# Patient Record
Sex: Female | Born: 1975 | Race: White | Hispanic: No | Marital: Married | Smoking: Never smoker
Health system: Southern US, Community
[De-identification: ages and names within clinical notes are randomized; demographics above are authoritative.]

## PROBLEM LIST (undated history)

## (undated) DIAGNOSIS — I10 Essential (primary) hypertension: Secondary | ICD-10-CM

## (undated) DIAGNOSIS — K589 Irritable bowel syndrome without diarrhea: Secondary | ICD-10-CM

## (undated) DIAGNOSIS — F32A Depression, unspecified: Secondary | ICD-10-CM

## (undated) DIAGNOSIS — R112 Nausea with vomiting, unspecified: Secondary | ICD-10-CM

## (undated) DIAGNOSIS — D497 Neoplasm of unspecified behavior of endocrine glands and other parts of nervous system: Secondary | ICD-10-CM

## (undated) DIAGNOSIS — I499 Cardiac arrhythmia, unspecified: Secondary | ICD-10-CM

## (undated) DIAGNOSIS — K219 Gastro-esophageal reflux disease without esophagitis: Secondary | ICD-10-CM

## (undated) DIAGNOSIS — I4711 Inappropriate sinus tachycardia, so stated: Secondary | ICD-10-CM

## (undated) DIAGNOSIS — F329 Major depressive disorder, single episode, unspecified: Secondary | ICD-10-CM

## (undated) DIAGNOSIS — F419 Anxiety disorder, unspecified: Secondary | ICD-10-CM

## (undated) DIAGNOSIS — G4730 Sleep apnea, unspecified: Secondary | ICD-10-CM

## (undated) DIAGNOSIS — Z9889 Other specified postprocedural states: Secondary | ICD-10-CM

## (undated) HISTORY — PX: TUBAL LIGATION: SHX77

## (undated) HISTORY — DX: Sleep apnea, unspecified: G47.30

## (undated) HISTORY — PX: APPENDECTOMY: SHX54

## (undated) HISTORY — DX: Gastro-esophageal reflux disease without esophagitis: K21.9

## (undated) HISTORY — DX: Irritable bowel syndrome, unspecified: K58.9

## (undated) HISTORY — DX: Neoplasm of unspecified behavior of endocrine glands and other parts of nervous system: D49.7

---

## 2000-11-09 HISTORY — PX: NASAL SEPTUM SURGERY: SHX37

## 2001-10-02 ENCOUNTER — Ambulatory Visit (HOSPITAL_COMMUNITY): Admission: RE | Admit: 2001-10-02 | Discharge: 2001-10-02 | Payer: Self-pay | Admitting: Internal Medicine

## 2001-10-02 ENCOUNTER — Encounter: Payer: Self-pay | Admitting: Internal Medicine

## 2004-11-09 HISTORY — PX: ESOPHAGOGASTRODUODENOSCOPY: SHX1529

## 2004-11-09 HISTORY — PX: COLONOSCOPY: SHX174

## 2008-11-09 HISTORY — PX: ESOPHAGOGASTRODUODENOSCOPY: SHX1529

## 2009-11-09 HISTORY — PX: CHOLECYSTECTOMY: SHX55

## 2009-11-09 HISTORY — PX: ESOPHAGOGASTRODUODENOSCOPY: SHX1529

## 2010-11-09 HISTORY — PX: TONSILLECTOMY: SUR1361

## 2012-06-06 ENCOUNTER — Other Ambulatory Visit: Payer: Self-pay | Admitting: Internal Medicine

## 2012-06-06 DIAGNOSIS — D352 Benign neoplasm of pituitary gland: Secondary | ICD-10-CM

## 2012-06-10 ENCOUNTER — Ambulatory Visit (HOSPITAL_COMMUNITY)
Admission: RE | Admit: 2012-06-10 | Discharge: 2012-06-10 | Disposition: A | Discharge status: Home / Self Care | Payer: Federal, State, Local not specified - PPO | Source: Ambulatory Visit | Attending: Internal Medicine | Admitting: Internal Medicine

## 2012-06-10 DIAGNOSIS — D353 Benign neoplasm of craniopharyngeal duct: Secondary | ICD-10-CM | POA: Insufficient documentation

## 2012-06-10 DIAGNOSIS — D352 Benign neoplasm of pituitary gland: Secondary | ICD-10-CM | POA: Insufficient documentation

## 2012-06-10 MED ORDER — GADOBENATE DIMEGLUMINE 529 MG/ML IV SOLN
15.0000 mL | Freq: Once | INTRAVENOUS | Status: AC
  Administered 2012-06-10: 15 mL via INTRAVENOUS

## 2014-09-13 ENCOUNTER — Ambulatory Visit (INDEPENDENT_AMBULATORY_CARE_PROVIDER_SITE_OTHER): Payer: Federal, State, Local not specified - PPO | Admitting: Gastroenterology

## 2014-09-13 ENCOUNTER — Encounter: Payer: Self-pay | Admitting: Gastroenterology

## 2014-09-13 VITALS — BP 139/94 | HR 89 | Temp 98.1°F | Ht 62.0 in | Wt 243.0 lb

## 2014-09-13 DIAGNOSIS — R1084 Generalized abdominal pain: Secondary | ICD-10-CM

## 2014-09-13 DIAGNOSIS — K589 Irritable bowel syndrome without diarrhea: Secondary | ICD-10-CM

## 2014-09-13 DIAGNOSIS — K219 Gastro-esophageal reflux disease without esophagitis: Secondary | ICD-10-CM | POA: Insufficient documentation

## 2014-09-13 DIAGNOSIS — R109 Unspecified abdominal pain: Secondary | ICD-10-CM | POA: Insufficient documentation

## 2014-09-13 DIAGNOSIS — K2100 Gastro-esophageal reflux disease with esophagitis, without bleeding: Secondary | ICD-10-CM

## 2014-09-13 DIAGNOSIS — E559 Vitamin D deficiency, unspecified: Secondary | ICD-10-CM | POA: Insufficient documentation

## 2014-09-13 LAB — CBC WITH DIFFERENTIAL/PLATELET
Basophils Absolute: 0 10*3/uL (ref 0.0–0.1)
Basophils Relative: 0 % (ref 0–1)
Eosinophils Absolute: 0.1 10*3/uL (ref 0.0–0.7)
Eosinophils Relative: 1 % (ref 0–5)
HCT: 38.4 % (ref 36.0–46.0)
Hemoglobin: 13 g/dL (ref 12.0–15.0)
Lymphocytes Relative: 30 % (ref 12–46)
Lymphs Abs: 2.1 10*3/uL (ref 0.7–4.0)
MCH: 28.3 pg (ref 26.0–34.0)
MCHC: 33.9 g/dL (ref 30.0–36.0)
MCV: 83.5 fL (ref 78.0–100.0)
Monocytes Absolute: 0.5 10*3/uL (ref 0.1–1.0)
Monocytes Relative: 7 % (ref 3–12)
Neutro Abs: 4.4 10*3/uL (ref 1.7–7.7)
Neutrophils Relative %: 62 % (ref 43–77)
Platelets: 250 10*3/uL (ref 150–400)
RBC: 4.6 MIL/uL (ref 3.87–5.11)
RDW: 13.7 % (ref 11.5–15.5)
WBC: 7.1 10*3/uL (ref 4.0–10.5)

## 2014-09-13 MED ORDER — ESOMEPRAZOLE MAGNESIUM 40 MG PO CPDR: 40.0000 mg | DELAYED_RELEASE_CAPSULE | Freq: Every day | ORAL | Status: DC

## 2014-09-13 MED ORDER — VITAMIN D (ERGOCALCIFEROL) 1.25 MG (50000 UNIT) PO CAPS: 50000.0000 [IU] | ORAL_CAPSULE | ORAL | Status: DC

## 2014-09-13 MED ORDER — PAROXETINE HCL 20 MG PO TABS: 20.0000 mg | ORAL_TABLET | Freq: Every day | ORAL | Status: DC

## 2014-09-13 MED ORDER — DICYCLOMINE HCL 10 MG PO CAPS: ORAL_CAPSULE | ORAL | Status: DC

## 2014-09-13 NOTE — Progress Notes (Signed)
Primary Care Physician:  Foye Spurling, MD  Primary Gastroenterologist:  Barney Drain, MD   Chief Complaint  Patient presents with  . Referral    GERD-Hiatial Hernia- IBS    HPI:  Tamara Lynch is a 38 y.o. female here as a self-referral for history of GERD, IBS. Previously gastroenterologist was Dr. Kenna Gilbert however he has subsequently retired. Last seen by him back in 2013. History of documented erosive reflux esophagitis but had evidence of good healing on Nexium in the past. Previously failed Prilosec, Protonix, AcipHex. Never tried Dexilant.   Ran out of Nexium. Tried over-the-counter Nexium but not working. On Paxil for management of IBS per patient but getting ready to run out. Her PCP left town and she's trying to get a new one. Only active MD right now is her endocrinologist, Dr. 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 She gives h/o vit D deficiency and ran out of her RX too.    Recently has had flareup of her IBS. No longer having any solid stools. Up to 6 BMs daily. No nocturnal diarrhea. Previously would have multiple stools daily but more solid. No melena, brbr. Worse for 4-6 months. Denies melena or rectal bleeding. Complains of postprandial abdominal pain especially with certain foods like roughage. Gives a history of bowel salt diarrhea as well. Ever tried on Questran or antispasmodics. Denies recent ill contacts, travel abroad, camping, antibiotic use.  Current Outpatient Prescriptions  Medication Sig Dispense Refill  . cabergoline (DOSTINEX) 0.5 MG tablet Take 0.25 mg by mouth 2 (two) times a week.    . esomeprazole (NEXIUM) 40 MG capsule Take 40 mg by mouth daily at 12 noon.    Marland Kitchen PARoxetine (PAXIL) 20 MG tablet Take 20 mg by mouth daily.    . Vitamin D, Ergocalciferol, (DRISDOL) 50000 UNITS CAPS capsule Take 50,000 Units by mouth every 7 (seven) days.     No current facility-administered medications for this visit.    Allergies as of 09/13/2014  . (Not on  File)    Past Medical History  Diagnosis Date  . IBS (irritable bowel syndrome)   . GERD (gastroesophageal reflux disease)   . 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 Cholecystectomy  2011  . Esophagogastroduodenoscopy    . Esophagogastroduodenoscopy    . Colonoscopy    . Tonsillectomy  2012  . Appendectomy      Family History  Problem Relation Age of Onset  . Barrett's esophagus Sister   . Colon cancer Neg Hx   . Colon polyps Neg Hx   . Cervical cancer Mother   . Breast cancer Mother   . Heart attack Father     age 3, deceased    History   Social History  . Marital Status: Married    Spouse Name: N/A    Number of Children: 2  . Years of Education: N/A   Occupational History  . special education teacher aide    Social History Main Topics  . Smoking status: Never Smoker   . Smokeless tobacco: Not on file  . Alcohol Use: No  . Drug Use: No  . Sexual Activity: Not on file   Other Topics Concern  . Not on file   Social History Narrative  . No narrative on file      ROS:  General: Negative for anorexia, weight loss, fever, chills, fatigue, weakness. Eyes: Negative for vision  changes.  ENT: Negative for hoarseness, difficulty swallowing , nasal congestion. CV: Negative for chest pain, angina, palpitations, dyspnea on exertion, peripheral edema.  Respiratory: Negative for dyspnea at rest, dyspnea on exertion, cough, sputum, wheezing.  GI: See history of present illness. GU:  Negative for dysuria, hematuria, urinary incontinence, urinary frequency, nocturnal urination.  MS: Negative for joint pain, low back pain.  Derm: Negative for rash or itching.  Neuro: Negative for weakness, abnormal sensation, seizure, frequent headaches, memory loss, confusion.  Psych: Negative for anxiety, depression, suicidal ideation, hallucinations.  Endo: Negative for unusual weight  change.  Heme: Negative for bruising or bleeding. Allergy: Negative for rash or hives.    Physical Examination:  BP 139/94 mmHg  Pulse 89  Temp(Src) 98.1 F (36.7 C) (Oral)  Ht 5\' 2"  (1.575 m)  Wt 243 lb (110.224 kg)  BMI 44.43 kg/m2   General: Well-nourished, well-developed in no acute distress.  Head: Normocephalic, atraumatic.   Eyes: Conjunctiva pink, no icterus. Mouth: Oropharyngeal mucosa moist and pink , no lesions erythema or exudate. Neck: Supple without thyromegaly, masses, or lymphadenopathy.  Lungs: Clear to auscultation bilaterally.  Heart: Regular rate and rhythm, no murmurs rubs or gallops.  Abdomen: Bowel sounds are normal, nontender, nondistended, no hepatosplenomegaly or masses, no abdominal bruits or    hernia , no rebound or guarding.   Rectal: not performed Extremities: No lower extremity edema. No clubbing or deformities.  Neuro: Alert and oriented x 4 , grossly normal neurologically.  Skin: Warm and dry, no rash or jaundice.   Psych: Alert and cooperative, normal mood and affect.   Imaging Studies: No results found.

## 2014-09-13 NOTE — Patient Instructions (Addendum)
1. RX for Nexium, Bentyl, Paxil sent to your mail-order. Future RX for Paxil needs to come from PCP. 2. RX for Vit D sent to [facility]. Future RX for Vit D needs to come from PCP. 3. Please have your labs done. 4. Return for follow up in 8 weeks.  5. Call in interim if you have any problems.

## 2014-09-14 ENCOUNTER — Encounter: Payer: Self-pay | Admitting: Gastroenterology

## 2014-09-14 LAB — COMPREHENSIVE METABOLIC PANEL
ALT: 28 U/L (ref 0–35)
AST: 16 U/L (ref 0–37)
Albumin: 4.7 g/dL (ref 3.5–5.2)
Alkaline Phosphatase: 58 U/L (ref 39–117)
BUN: 11 mg/dL (ref 6–23)
CO2: 22 mEq/L (ref 19–32)
Calcium: 9.3 mg/dL (ref 8.4–10.5)
Chloride: 103 mEq/L (ref 96–112)
Creat: 0.57 mg/dL (ref 0.50–1.10)
Glucose, Bld: 69 mg/dL — ABNORMAL LOW (ref 70–99)
Potassium: 3.7 mEq/L (ref 3.5–5.3)
Sodium: 138 mEq/L (ref 135–145)
Total Bilirubin: 1 mg/dL (ref 0.2–1.2)
Total Protein: 7 g/dL (ref 6.0–8.3)

## 2014-09-14 LAB — VITAMIN D 25 HYDROXY (VIT D DEFICIENCY, FRACTURES): Vit D, 25-Hydroxy: 25 ng/mL — ABNORMAL LOW (ref 30–89)

## 2014-09-14 NOTE — Assessment & Plan Note (Signed)
38 year old lady gives a 10 year history of IBS with frequent loose stools, previously managed with Paxil as symptoms typically were aggravated by anxiety. She denies ever trying antispasmodic therapy. Previous colonoscopy unremarkable as outlined. Celiac screen negative along with negative small bowel biopsies as well. She gives a history of bowel salt diarrhea since her cholecystectomy as well.  Obtain baseline labs. Trial of Bentyl. Prescription provided. Renewed her Paxil for 90 day supply, future refills to be obtained from future PCP.  Return to the office for follow-up in 8 weeks.

## 2014-09-14 NOTE — Assessment & Plan Note (Signed)
Recent flare off of medication. Resume Nexium 40 mg daily. Prescription provided. Discussed antireflux measures. Return to the office in 8 weeks for follow-up.

## 2014-09-14 NOTE — Assessment & Plan Note (Signed)
Check labs. Limited refills provided. I have advised her to get future refills from PCP (she is trying to find one) or endocrinologist.

## 2014-09-17 ENCOUNTER — Encounter: Payer: Self-pay | Admitting: Gastroenterology

## 2014-09-17 DIAGNOSIS — R197 Diarrhea, unspecified: Secondary | ICD-10-CM

## 2014-09-18 NOTE — Progress Notes (Signed)
Quick Note:  Pt is aware. ______ 

## 2014-09-18 NOTE — Progress Notes (Signed)
Quick Note:  Please let patient know her labs look good except her Vitamin D is slightly low. She is on supplement.  I recommend she discuss her ongoing vit D deficiency with Dr. Carlis Abbott her endocrinologist. ______

## 2014-09-19 NOTE — Telephone Encounter (Signed)
Please have patient collect stool studies (C.diff, culture, Giardia). Orders entered.   Increase bentyl to max of 20mg  QID when diarrhea is more severe.  Does she have any blood in stool, fever? Get more details about her pain?

## 2014-09-19 NOTE — Progress Notes (Signed)
cc'ed to pcp °

## 2014-09-21 NOTE — Telephone Encounter (Signed)
Has this been addressed?

## 2014-09-21 NOTE — Telephone Encounter (Signed)
I spoke with the pt. She finally got the Bentyl from the mail order and she has been taking it and feeling much better. She is not having diarrhea at this time. She did say that she did not get her nexium from them and asked if I could check on it. I called [facility] and it needs a PA . I will get PA done today.

## 2014-09-21 NOTE — Telephone Encounter (Signed)
PA has been done and faxed to the insurance company. Sent message to the pt via mychart.

## 2014-09-21 NOTE — Telephone Encounter (Signed)
Thank you :)

## 2014-10-03 ENCOUNTER — Telehealth: Payer: Self-pay | Admitting: *Deleted

## 2014-10-03 NOTE — Telephone Encounter (Signed)
  [place] APPROVED NEXIUM. VALID 07/24/2014 - 09/22/15

## 2014-10-29 ENCOUNTER — Encounter: Payer: Self-pay | Admitting: Gastroenterology

## 2014-11-13 ENCOUNTER — Telehealth: Payer: Self-pay | Admitting: Nurse Practitioner

## 2014-11-13 DIAGNOSIS — R197 Diarrhea, unspecified: Secondary | ICD-10-CM

## 2014-11-13 NOTE — Progress Notes (Signed)
We are sorry that you are not feeling well.  Here is how we plan to help!  Based on what you have shared with me it looks like you have Acute Infectious Diarrhea.  Most cases of acute diarrhea are due to infections with virus and bacteria and are self-limited conditions lasting less than 14 days.  For your symptoms you may take Imodium 2 mg tablets that are over the counter at your local pharmacy. Take two tablet now and then one after each loose stool up to 6 a day.  Antibiotics are not needed for most people with diarrhea.  Optional: I have sent in Cipro 500 mg two tablets twice a day for five days or azithromycin 500 mg daily for 3 days.  HOME CARE  We recommend changing your diet to help with your symptoms for the next few days.  Drink plenty of fluids that contain water salt and sugar. Sports drinks such as Gatorade may help.   You may try broths, soups, bananas, applesauce, soft breads, mashed potatoes or crackers.   You are considered infectious for as long as the diarrhea continues. Hand washing or use of alcohol based hand sanitizers is recommend.  It is best to stay out of work or school until your symptoms stop.   GET HELP RIGHT AWAY  If you have dark yellow colored urine or do not pass urine frequently you should drink more fluids.    If your symptoms worsen   If you feel like you are going to pass out (faint)  You have a new problem  MAKE SURE YOU   Understand these instructions.  Will watch your condition.  Will get help right away if you are not doing well or get worse.  Your e-visit answers were reviewed by a board certified advanced clinical practitioner to complete your personal care plan.  Depending on the condition, your plan could have included both over the counter or prescription medications.  If there is a problem please reply  once you have received a response from your provider.  Your safety is important to [place].  If you have drug allergies check  your prescription carefully.    You can use MyChart to ask questions about today's visit, request a non-urgent call back, or ask for a work or school excuse.  You will get an e-mail in the next two days asking about your experience.  I hope that your e-visit has been valuable and will speed your recovery. Thank you for using e-visits.

## 2014-12-18 ENCOUNTER — Other Ambulatory Visit: Payer: Self-pay

## 2015-01-28 ENCOUNTER — Other Ambulatory Visit: Payer: Self-pay | Admitting: Gastroenterology

## 2015-01-29 NOTE — Telephone Encounter (Signed)
Needs further refills for Vit D through PCP.

## 2015-06-05 ENCOUNTER — Encounter: Payer: Self-pay | Admitting: Gastroenterology

## 2015-06-05 ENCOUNTER — Ambulatory Visit (INDEPENDENT_AMBULATORY_CARE_PROVIDER_SITE_OTHER): Payer: Federal, State, Local not specified - PPO | Admitting: Gastroenterology

## 2015-06-05 VITALS — BP 143/88 | HR 85 | Temp 97.2°F | Ht 65.0 in | Wt 243.2 lb

## 2015-06-05 DIAGNOSIS — K589 Irritable bowel syndrome without diarrhea: Secondary | ICD-10-CM | POA: Diagnosis not present

## 2015-06-05 DIAGNOSIS — K219 Gastro-esophageal reflux disease without esophagitis: Secondary | ICD-10-CM

## 2015-06-05 MED ORDER — ESOMEPRAZOLE MAGNESIUM 40 MG PO CPDR: 40.0000 mg | DELAYED_RELEASE_CAPSULE | Freq: Two times a day (BID) | ORAL | Status: DC

## 2015-06-05 MED ORDER — RIFAXIMIN 550 MG PO TABS: 550.0000 mg | ORAL_TABLET | Freq: Three times a day (TID) | ORAL | Status: DC

## 2015-06-05 NOTE — Assessment & Plan Note (Signed)
Breakthrough symptoms couple of times per week. Significant discomfort when they occur. Trial of taking extra Nexium as needed but limit as much as possible. Antireflux measures discussed. She was advised not to increase Celexa beyond 20mg  as long as she is on Nexium due to potential side effects. Progress report in couple of weeks. It is unclear if abdominal pain related to gastritis, gerd, or IBS. If ongoing symptoms, may need further work up.

## 2015-06-05 NOTE — Patient Instructions (Signed)
1. Take Nexium once daily before breakfast but okay to take additional dose on days you have breakthrough heartburn. Try to limit using second dose of Nexium as much as possible. If you have to increase Celexa at any time, we may need to switch out Nexium because the Nexium and high dose Celexa and interact. RX sent to [facility].  2. Trial of Xifaxan. Take three times daily for 14 days. RX sent to [facility]. 3. Let me know in a couple of weeks how you are doing.  4. Return to the office in six months or sooner if needed.

## 2015-06-05 NOTE — Assessment & Plan Note (Signed)
Bentyl helps control diarrhea but still with constant crampy pain. Trial of Xifaxan for two weeks. If ongoing pain, she will let me know. Further work up may be needed.

## 2015-06-05 NOTE — Progress Notes (Signed)
Please find out what patient's allergies are. None have ever been recorded in EPIC.

## 2015-06-05 NOTE — Progress Notes (Signed)
Primary Care Physician: Yvone Neu, MD  Primary Gastroenterologist:  Barney Drain, MD   Chief Complaint  Patient presents with  . Follow-up    HPI: Tamara Lynch is a 39 y.o. female here for follow up GERD, IBS. Last seen in 09/2015.   H/O documented erosive reflux esophagitis but had evidence of good healing on Nexium in past. Previously failed Aciphex, Prilosec, Protonix, Aciphex. Never tried Dexilant.   Complained of intermittent heartburn on Nexium. Doesn't matter what she eats but worse with meals. Burns so bad, cannot eat rest of day. Always postprandial. TUMS helps very little. No dysphagia. No NSAIDS/ASA. No melena.   Complains of constant abdominal pain, crampy in nature. Not worse with meals. Sometimes wakes up in middle of night due to pain and has to have BM, rarely happens. Not always associated with BM. No brbpr. Takes bentyl every Monday morning and this helps eliminate diarrhea. If takes more frequently then she has constipation. Switched from Paxil to Celexa which has also helped with diarrhea related to anxiety.     Current Outpatient Prescriptions  Medication Sig Dispense Refill  . cabergoline (DOSTINEX) 0.5 MG tablet Take 0.25 mg by mouth 2 (two) times a week.    . citalopram (CELEXA) 20 MG tablet Take 20 mg by mouth daily.     Marland Kitchen dicyclomine (BENTYL) 10 MG capsule Take 1-2 orally before meals and at bedtime as needed for diarrhea and abdominal pain. 540 capsule 3  . esomeprazole (NEXIUM) 40 MG capsule Take 1 capsule (40 mg total) by mouth daily before breakfast. 90 capsule 3  . Vitamin D, Ergocalciferol, (DRISDOL) 50000 UNITS CAPS capsule Take 1 capsule (50,000 Units total) by mouth every 7 (seven) days. 4 capsule 2   No current facility-administered medications for this visit.    Allergies as of 06/05/2015  . (Not on File)   Past Surgical History  Procedure Laterality Date  . Cholecystectomy  2011  . Esophagogastroduodenoscopy  2011   Dr. Algis Greenhouse. Propofol. hiatal hernia. No Barrett's.  . Esophagogastroduodenoscopy  2010    Dr. Algis Greenhouse: propofol.hiatal hernia and erosions. YQ:IHKV chronic gastritis/esophagitis  . Colonoscopy  2006    Dr. Algis Greenhouse: normal colonoscopy, Biopsy showed mild chronic inflammation. No evidence of microscopic colitis.  . Tonsillectomy  2012  . Appendectomy    . Esophagogastroduodenoscopy  2006    Dr. Docia Furl: normal upper endoscopy. Biopsies of the duodenum negative for celiac disease.TTG also normal    ROS:  General: Negative for anorexia, weight loss, fever, chills, fatigue, weakness. ENT: Negative for hoarseness, difficulty swallowing , nasal congestion. CV: Negative for chest pain, angina, palpitations, dyspnea on exertion, peripheral edema.  Respiratory: Negative for dyspnea at rest, dyspnea on exertion, cough, sputum, wheezing.  GI: See history of present illness. GU:  Negative for dysuria, hematuria, urinary incontinence, urinary frequency, nocturnal urination.  Endo: Negative for unusual weight change.    Physical Examination:   BP 143/88 mmHg  Pulse 85  Temp(Src) 97.2 F (36.2 C) (Oral)  Ht 5\' 5"  (1.651 m)  Wt 243 lb 3.2 oz (110.315 kg)  BMI 40.47 kg/m2  General: Well-nourished, well-developed in no acute distress.  Eyes: No icterus. Mouth: Oropharyngeal mucosa moist and pink , no lesions erythema or exudate. Lungs: Clear to auscultation bilaterally.  Heart: Regular rate and rhythm, no murmurs rubs or gallops.  Abdomen: Bowel sounds are normal, moderate epigastric tenderness, mild mid to left sided tenderness, nondistended, no hepatosplenomegaly or masses, no abdominal bruits or hernia ,  no rebound or guarding.   Extremities: No lower extremity edema. No clubbing or deformities. Neuro: Alert and oriented x 4   Skin: Warm and dry, no jaundice.   Psych: Alert and cooperative, normal mood and affect.    Imaging Studies: No results found.

## 2015-06-10 NOTE — Progress Notes (Signed)
CC'ED TO PCP 

## 2015-06-17 ENCOUNTER — Telehealth: Payer: Federal, State, Local not specified - PPO | Admitting: Family

## 2015-06-17 DIAGNOSIS — R197 Diarrhea, unspecified: Secondary | ICD-10-CM

## 2015-06-17 NOTE — Progress Notes (Signed)
We are sorry that you are not feeling well.  Here is how we plan to help!  Based on what you have shared with me it looks like you have Acute Infectious Diarrhea.  Most cases of acute diarrhea are due to infections with virus and bacteria and are self-limited conditions lasting less than 14 days.  For your symptoms you may take Imodium 2 mg tablets that are over the counter at your local pharmacy. Take two tablet now and then one after each loose stool up to 6 a day.  Antibiotics are not needed for most people with diarrhea.   HOME CARE  We recommend changing your diet to help with your symptoms for the next few days.  Drink plenty of fluids that contain water salt and sugar. Sports drinks such as Gatorade may help.   You may try broths, soups, bananas, applesauce, soft breads, mashed potatoes or crackers.   You are considered infectious for as long as the diarrhea continues. Hand washing or use of alcohol based hand sanitizers is recommend.  It is best to stay out of work or school until your symptoms stop.   GET HELP RIGHT AWAY  If you have dark yellow colored urine or do not pass urine frequently you should drink more fluids.    If your symptoms worsen   If you feel like you are going to pass out (faint)  You have a new problem  MAKE SURE YOU   Understand these instructions.  Will watch your condition.  Will get help right away if you are not doing well or get worse.  Your e-visit answers were reviewed by a board certified advanced clinical practitioner to complete your personal care plan.  Depending on the condition, your plan could have included both over the counter or prescription medications.  If there is a problem please reply  once you have received a response from your provider.  Your safety is important to us.  If you have drug allergies check your prescription carefully.    You can use MyChart to ask questions about today's visit, request a non-urgent call  back, or ask for a work or school excuse.  You will get an e-mail in the next two days asking about your experience.  I hope that your e-visit has been valuable and will speed your recovery. Thank you for using e-visits.   

## 2015-06-18 ENCOUNTER — Telehealth: Payer: Self-pay | Admitting: Gastroenterology

## 2015-06-18 NOTE — Telephone Encounter (Signed)
Pt said she is having a flare. A lot of abdominal cramping. She takes the max amount of Bentyl and Nexium bid. She could not afford the Xifaxin. Please advise!

## 2015-06-18 NOTE — Telephone Encounter (Signed)
PATIENT CALLED STATING SHE WAS HAVING A BAD IBS FLARE UP AND THE ZIFAXIN IS TOO HIGH   PLEASE ADVISE IF ANYTHING ELSE CAN BE CALLED IN FOR HER [phone]

## 2015-06-19 MED ORDER — DEXLANSOPRAZOLE 60 MG PO CPDR: 60.0000 mg | DELAYED_RELEASE_CAPSULE | Freq: Every day | ORAL | Status: DC

## 2015-06-19 MED ORDER — ELUXADOLINE 75 MG PO TABS: 75.0000 mg | ORAL_TABLET | Freq: Two times a day (BID) | ORAL | Status: DC

## 2015-06-19 NOTE — Telephone Encounter (Addendum)
1. Please let patient know that I am going to send in RX for Dexilant 60mg  daily before breakfast (stop Nexium) and Viberzi 75mg  BID with food (stop Bentyl). ACTUALLY I PRINTED SINCE I DIDN''T KNOW WHICH LOCAL PHARMACY AND VIBERZI WILL NOT E-SCRIBE.Viberzi is for IBS-diarrhea. Hold if gets constipated. Do not consume etoh with it. 2. Please have her activate the rebate cards for both before trying to get RX filled. 3. Call if not covered and we will try prior authorization.  4. Feel free to sample few days of each to patient. 5. Call if no better after on new meds for 2 weeks OR if things worsen.

## 2015-06-19 NOTE — Telephone Encounter (Signed)
Pt called back to the office and she states that the following.   1. Allergic to Biaxin and Tequin 2. 6-8 stools per day 3. Never has constipation 4. Heart burn isn't well controlled 5. No fever 6.no blood in stool

## 2015-06-19 NOTE — Telephone Encounter (Signed)
Pt is aware and husband will be by to pick up samples

## 2015-06-19 NOTE — Addendum Note (Signed)
Addended by: Mahala Menghini on: 06/19/2015 10:22 AM   Modules accepted: Orders, Medications

## 2015-06-19 NOTE — Telephone Encounter (Signed)
This was sent to me after I left yesterday.  I tried to call patient and had to leave message.   Please find out: What drug allergies she has? How many stools daily? Does she ever have constipation? Is her heartburn well controlled? Does she have fever? Does she have blood in stool or melena?

## 2015-06-24 ENCOUNTER — Telehealth: Payer: Self-pay

## 2015-06-24 ENCOUNTER — Encounter: Payer: Self-pay | Admitting: Gastroenterology

## 2015-06-24 NOTE — Telephone Encounter (Signed)
Pt called and states that she sent an email through mychart this morning. She states that the samples have not helped and she isn't any better. Please advise

## 2015-06-27 NOTE — Telephone Encounter (Signed)
Please let patient know that it may be too soon to tell if Dexilant is going to help her burning since she has been on it only a week. If she would like to give some more time, I would recommend adding Zantac 150mg  BID or Pepcid 20mg  BID, OR we could call in some carafate.   For diarrhea, abdominal cramping, is the Viberzi helping yet?

## 2015-06-28 MED ORDER — SUCRALFATE 1 GM/10ML PO SUSP: 1.0000 g | Freq: Three times a day (TID) | ORAL | Status: DC

## 2015-06-28 NOTE — Telephone Encounter (Signed)
LMOM for a return call and told her we close at 12:00 noon today.

## 2015-06-28 NOTE — Telephone Encounter (Signed)
Pt called and was informed. She said she has had Carafate before and it helped a lot. She would like a prescription for that.  Also, she said the Viberzi is helping diarrhea, but she is still having abdominal cramps after every meal.  Please advise!

## 2015-06-28 NOTE — Telephone Encounter (Signed)
LMOM for pt that if I do not hear from her before we leave at noon, that I will fax the Rx for Carafate to [facility] in [place].

## 2015-06-28 NOTE — Telephone Encounter (Signed)
Please fax Carafate RX. I didn't know which pharmacy to send it too.  Let patient know that I will touch base with Dr. Oneida Alar and see if she has any further recommendations for the cramping. May be first of the week.

## 2015-07-16 ENCOUNTER — Encounter: Payer: Self-pay | Admitting: Gastroenterology

## 2015-07-18 NOTE — Telephone Encounter (Signed)
I will have our staff call to schedule you a follow-up appointment.

## 2015-07-18 NOTE — Telephone Encounter (Signed)
Please call patient to schedule an appointment with someone for worsening GERD (see patient email for further details.)

## 2015-08-02 ENCOUNTER — Other Ambulatory Visit: Payer: Self-pay

## 2015-08-02 ENCOUNTER — Ambulatory Visit (INDEPENDENT_AMBULATORY_CARE_PROVIDER_SITE_OTHER): Payer: Federal, State, Local not specified - PPO | Admitting: Gastroenterology

## 2015-08-02 ENCOUNTER — Encounter: Payer: Self-pay | Admitting: Gastroenterology

## 2015-08-02 VITALS — BP 142/88 | HR 73 | Temp 97.4°F | Ht 62.0 in | Wt 240.8 lb

## 2015-08-02 DIAGNOSIS — R1319 Other dysphagia: Secondary | ICD-10-CM | POA: Insufficient documentation

## 2015-08-02 DIAGNOSIS — K219 Gastro-esophageal reflux disease without esophagitis: Secondary | ICD-10-CM

## 2015-08-02 DIAGNOSIS — K589 Irritable bowel syndrome without diarrhea: Secondary | ICD-10-CM

## 2015-08-02 DIAGNOSIS — R1314 Dysphagia, pharyngoesophageal phase: Secondary | ICD-10-CM

## 2015-08-02 DIAGNOSIS — R1310 Dysphagia, unspecified: Secondary | ICD-10-CM

## 2015-08-02 NOTE — Assessment & Plan Note (Addendum)
39 year old female with history of erosive reflux esophagitis who presents with several month history of refractory symptoms. Failed multiple PPIs as outlined above. Recently showed no signs of improvement on Dexilant. Now with esophageal dysphagia to pills and solid food. Last endoscopy 2011. Recommend EGD at this time for further evaluation. Possible esophageal dilation. Need to exclude complicated GERD, esophagitis related to viral or yeast infection, eosinophilic esophagitis. Previously had EGD with propofol given history of chronic and presence. We'll plan on deep sedation this time as well.  I have discussed the risks, alternatives, benefits with regards to but not limited to the risk of reaction to medication, bleeding, infection, perforation and the patient is agreeable to proceed. Written consent to be obtained.  For now she will increase Nexium to 40 mg twice daily before meals. Continue Carafate 4 times daily. May use Zantac and/or times in addition as well. Discussed antireflux measures.

## 2015-08-02 NOTE — Progress Notes (Signed)
CC'D TO PCP °

## 2015-08-02 NOTE — Progress Notes (Signed)
Primary Care Physician: Yvone Neu, MD  Primary Gastroenterologist:  Barney Drain, MD   Chief Complaint  Patient presents with  . Gastrophageal Reflux    HPI: Tamara Lynch is a 39 y.o. female here for follow-up of GERD and IBS. Last seen in July 2016. At that time she was switched from Nexium to [place] for refractory GERD. She was also given a trial of Viberzi for abdominal cramps and diarrhea.  History of documented erosive reflux esophagitis but had evidence of good healing on Nexium in the past. Previously failed AcipHex, Prilosec, Protonix.  Presents today with refractory GERD. Dexilant did not help. Was on it for six weeks or so. Having to take Carafate four times per day. Throat feels raw. Burning all the time. Severe. Keeps her up at night. One weekend had vomiting with it. Vomiting nonbloody emesis with bile. Hoarseness. Currently back on Nexium for past few days. Dysphagia to pills now over the last few weeks. Having to wash down food.    Viberzi worked for [employer], helped diarrhea. Was on it for few weeks and then started feeling tingling sensation from head to toes and felt like going to pass out. Felt like whole body asleep. Symptoms resolved after stopping the medication. IBS okay right now. Some diarrhea but not bad. No melena, brbpr.   Under a lot of stress. Going to change to Prestiq when get in the mail. Will be weaning of celexa. Valium prn.   Current Outpatient Prescriptions  Medication Sig Dispense Refill  . cabergoline (DOSTINEX) 0.5 MG tablet Take 0.25 mg by mouth 2 (two) times a week.    . citalopram (CELEXA) 20 MG tablet Take 20 mg by mouth daily.     Marland Kitchen dexlansoprazole (DEXILANT) 60 MG capsule Take 1 capsule (60 mg total) by mouth daily. 30 capsule 3  . sucralfate (CARAFATE) 1 GM/10ML suspension Take 10 mLs (1 g total) by mouth 4 (four) times daily -  with meals and at bedtime. 420 mL 1  . Vitamin D, Ergocalciferol, (DRISDOL) 50000 UNITS CAPS  capsule Take 1 capsule (50,000 Units total) by mouth every 7 (seven) days. 4 capsule 2   No current facility-administered medications for this visit.    Allergies as of 08/02/2015 - Review Complete 08/02/2015  Allergen Reaction Noted  . Biaxin [clarithromycin]  06/19/2015  . Tequin [gatifloxacin]  06/19/2015   Past Medical History  Diagnosis Date  . IBS (irritable bowel syndrome)   . GERD (gastroesophageal reflux disease)   . 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 Sleep apnea    Past Surgical History  Procedure Laterality Date  . Cholecystectomy  2011  . Esophagogastroduodenoscopy  2011    Dr. Algis Greenhouse. Propofol. hiatal hernia. No Barrett's.  . Esophagogastroduodenoscopy  2010    Dr. Algis Greenhouse: propofol.hiatal hernia and erosions. KR:CVKF chronic gastritis/esophagitis  . Colonoscopy  2006    Dr. Algis Greenhouse: normal colonoscopy, Biopsy showed mild chronic inflammation. No evidence of microscopic colitis.  . Tonsillectomy  2012  . Appendectomy    . Esophagogastroduodenoscopy  2006    Dr. Docia Furl: normal upper endoscopy. Biopsies of the duodenum negative for celiac disease.TTG also normal   Family History  Problem Relation Age of Onset  . Barrett's esophagus Sister   . Colon cancer Neg Hx   . Colon polyps Neg Hx   . Cervical cancer Mother   . Breast cancer Mother   . Heart attack Father  age 1, deceased   Social History   Social History  . Marital Status: Married    Spouse Name: N/A  . Number of Children: 2  . Years of Education: N/A   Occupational History  . special education teacher aide    Social History Main Topics  . Smoking status: Never Smoker   . Smokeless tobacco: None  . Alcohol Use: No  . Drug Use: No  . Sexual Activity: Not Asked   Other Topics Concern  . None   Social History Narrative    ROS:  General: Negative for anorexia, weight loss, fever, chills, fatigue, weakness. ENT: Negative for  hoarseness, difficulty swallowing , nasal congestion. CV: Negative for chest pain, angina, palpitations, dyspnea on exertion, peripheral edema.  Respiratory: Negative for dyspnea at rest, dyspnea on exertion, cough, sputum, wheezing.  GI: See history of present illness. GU:  Negative for dysuria, hematuria, urinary incontinence, urinary frequency, nocturnal urination.  Endo: Negative for unusual weight change.    Physical Examination:   BP 142/88 mmHg  Pulse 73  Temp(Src) 97.4 F (36.3 C) (Oral)  Ht 5\' 2"  (1.575 m)  Wt 240 lb 12.8 oz (109.226 kg)  BMI 44.03 kg/m2  General: Well-nourished, well-developed in no acute distress.  Eyes: No icterus. Mouth: Oropharyngeal mucosa moist and pink , no lesions erythema or exudate. Lungs: Clear to auscultation bilaterally.  Heart: Regular rate and rhythm, no murmurs rubs or gallops.  Abdomen: Bowel sounds are normal, moderate epigastric tenderness, nondistended, no hepatosplenomegaly or masses, no abdominal bruits or hernia , no rebound or guarding.   Extremities: No lower extremity edema. No clubbing or deformities. Neuro: Alert and oriented x 4   Skin: Warm and dry, no jaundice.   Psych: Alert and cooperative, normal mood and affect.

## 2015-08-02 NOTE — Assessment & Plan Note (Signed)
Did not tolerate Viberzi. IBS fairly inactive at this time.

## 2015-08-02 NOTE — Patient Instructions (Signed)
1. Nexium twice daily before breakfast and evening meal. 2. Upper endoscopy with Dr. Oneida Alar. See separate instructions.

## 2015-08-02 NOTE — Patient Instructions (Signed)
Tamara Lynch  08/02/2015     @PREFPERIOPPHARMACY @   Your procedure is scheduled on  08/06/2015  Report to [facility] at  53  A.M.  Call this number if you have problems the morning of surgery:  [phone]   Remember:  Do not eat food or drink liquids after midnight.  Take these medicines the morning of surgery with A SIP OF WATER  Cabergoline, celexa, nexium.   Do not wear jewelry, make-up or nail polish.  Do not wear lotions, powders, or perfumes.  You may wear deodorant.  Do not shave 48 hours prior to surgery.  Men may shave face and neck.  Do not bring valuables to the hospital.  [facility] is not responsible for any belongings or valuables.  Contacts, dentures or bridgework may not be worn into surgery.  Leave your suitcase in the car.  After surgery it may be brought to your room.  For patients admitted to the hospital, discharge time will be determined by your treatment team.  Patients discharged the day of surgery will not be allowed to drive home.   Name and phone number of your driver:   family Special instructions:  Follow the diet instructions given to you by Dr Nona Dell office.  Please read over the following fact sheets that you were given. Pain Booklet, Coughing and Deep Breathing, Surgical Site Infection Prevention, Anesthesia Post-op Instructions and Care and Recovery After Surgery      Esophagogastroduodenoscopy Esophagogastroduodenoscopy (EGD) is a procedure to examine the lining of the esophagus, stomach, and first part of the small intestine (duodenum). A long, flexible, lighted tube with a camera attached (endoscope) is inserted down the throat to view these organs. This procedure is done to detect problems or abnormalities, such as inflammation, bleeding, ulcers, or growths, in order to treat them. The procedure lasts about 5-20 minutes. It is usually an outpatient procedure, but it may need to be performed in emergency cases in the  hospital. LET YOUR CAREGIVER KNOW ABOUT:   Allergies to food or medicine.  All medicines you are taking, including vitamins, herbs, eyedrops, and over-the-counter medicines and creams.  Use of steroids (by mouth or creams).  Previous problems you or members of your family have had with the use of anesthetics.  Any blood disorders you have.  Previous surgeries you have had.  Other health problems you have.  Possibility of pregnancy, if this applies. RISKS AND COMPLICATIONS  Generally, EGD is a safe procedure. However, as with any procedure, complications can occur. Possible complications include:  Infection.  Bleeding.  Tearing (perforation) of the esophagus, stomach, or duodenum.  Difficulty breathing or not being able to breath.  Excessive sweating.  Spasms of the larynx.  Slowed heartbeat.  Low blood pressure. BEFORE THE PROCEDURE  Do not eat or drink anything for 6-8 hours before the procedure or as directed by your caregiver.  Ask your caregiver about changing or stopping your regular medicines.  If you wear dentures, be prepared to remove them before the procedure.  Arrange for someone to drive you home after the procedure. PROCEDURE   A vein will be accessed to give medicines and fluids. A medicine to relax you (sedative) and a pain reliever will be given through that access into the vein.  A numbing medicine (local anesthetic) may be sprayed on your throat for comfort and to stop you from gagging or coughing.  A mouth guard may be placed in your  mouth to protect your teeth and to keep you from biting on the endoscope.  You will be asked to lie on your left side.  The endoscope is inserted down your throat and into the esophagus, stomach, and duodenum.  Air is put through the endoscope to allow your caregiver to view the lining of your esophagus clearly.  The esophagus, stomach, and duodenum is then examined. During the exam, your caregiver  may:  Remove tissue to be examined under a microscope (biopsy) for inflammation, infection, or other medical problems.  Remove growths.  Remove objects (foreign bodies) that are stuck.  Treat any bleeding with medicines or other devices that stop tissues from bleeding (hot cautery, clipping devices).  Widen (dilate) or stretch narrowed areas of the esophagus and stomach.  The endoscope will then be withdrawn. AFTER THE PROCEDURE  You will be taken to a recovery area to be monitored. You will be able to go home once you are stable and alert.  Do not eat or drink anything until the local anesthetic and numbing medicines have worn off. You may choke.  It is normal to feel bloated, have pain with swallowing, or have a sore throat for a short time. This will wear off.  Your caregiver should be able to discuss his or her findings with you. It will take longer to discuss the test results if any biopsies were taken. Document Released: 02/26/2005 Document Revised: 03/12/2014 Document Reviewed: 09/28/2012 [facility] Patient Information 2015 [place], [place]. This information is not intended to replace advice given to you by your health care provider. Make sure you discuss any questions you have with your health care provider. Esophageal Dilatation The esophagus is the long, narrow tube which carries food and liquid from the mouth to the stomach. Esophageal dilatation is the technique used to stretch a blocked or narrowed portion of the esophagus. This procedure is used when a part of the esophagus has become so narrow that it becomes difficult, painful or even impossible to swallow. This is generally an uncomplicated form of treatment. When this is not successful, chest surgery may be required. This is a much more extensive form of treatment with a longer recovery time. CAUSES  Some of the more common causes of blockage or strictures of the esophagus are:  Narrowing from longstanding inflammation  (soreness and redness) of the lower esophagus. This comes from the constant exposure of the lower esophagus to the acid which bubbles up from the stomach. Over time this causes scarring and narrowing of the lower esophagus.  Hiatal hernia in which a small part of the stomach bulges (herniates) up through the diaphragm. This can cause a gradual narrowing of the end of the esophagus.  Schatzki ring is a narrow ring of benign (non-cancerous) fibrous tissue which constricts the lower esophagus. The reason for this is not known.  Scleroderma is a connective tissue disorder that affects the esophagus and makes swallowing difficult.  Achalasia is an absence of nerves to the lower esophagus and to the esophageal sphincter. This is the circular muscle between the stomach and esophagus that relaxes to allow food into the stomach. After swallowing, it contracts to keep food in the stomach. This absence of nerves may be congenital (present since birth). This can cause irregular spasms of the lower esophageal muscle. This spasm does not open up to allow food and fluid through. The result is a persistent blockage with subsequent slow trickling of the esophageal contents into the stomach.  Strictures may develop  from swallowing materials which damage the esophagus. Some examples are strong acids or alkalis such as lye.  Growths such as benign (non-cancerous) and malignant (cancerous) tumors can block the esophagus.  Hereditary (present since birth) causes. DIAGNOSIS  Your caregiver often suspects this problem by taking a medical history. They will also do a physical exam. They can then prove their suspicions using X-rays and endoscopy. Endoscopy is an exam in which a tube like a small, flexible telescope is used to look at your esophagus.  TREATMENT There are different stretching (dilating) techniques that can be used. Simple bougie dilatation may be done in the office. This usually takes only a couple minutes. A  numbing (anesthetic) spray of the throat is used. Endoscopy, when done, is done in an endoscopy suite under mild sedation. When fluoroscopy is used, the procedure is performed in X-ray. Other techniques require a little longer time. Recovery is usually quick. There is no waiting time to begin eating and drinking to test success of the treatment. Following are some of the methods used. Narrowing of the esophagus is treated by making it bigger. Commonly this is a mechanical problem which can be treated with stretching. This can be done in different ways. Your caregiver will discuss these with you. Some of the means used are:  A series of graduated (increasing thickness) flexible dilators can be used. These are weighted tubes passed through the esophagus into the stomach. The tubes used become progressively larger until the desired stretched size is reached. Graduated dilators are a simple and quick way of opening the esophagus. No visualization is required.  Another method is the use of endoscopy to place a flexible wire across the stricture. The endoscope is removed and the wire left in place. A dilator with a hole through it from end to end is guided down the esophagus and across the stricture. One or more of these dilators are passed over the wire. At the end of the exam, the wire is removed. This type of treatment may be performed in the X-ray department under fluoroscopy. An advantage of this procedure is the examiner is visualizing the end opening in the esophagus.  Stretching of the esophagus may be done using balloons. Deflated balloons are placed through the endoscope and across the stricture. This type of balloon dilatation is often done at the time of endoscopy or fluoroscopy. Flexible endoscopy allows the examiner to directly view the stricture. A balloon is inserted in the deflated form into the area of narrowing. It is then inflated with air to a certain pressure that is preset for a given  circumference. When inflated, it becomes sausage shaped, stretched, and makes the stricture larger.  Achalasia requires a longer, larger balloon-type dilator. This is frequently done under X-ray control. In this situation, the spastic muscle fibers in the lower esophagus are stretched. All of the above procedures make the passage of food and water into the stomach easier. They also make it easier for stomach contents to reflux back into the esophagus. Special medications may be used following the procedure to help prevent further stricturing. Proton-pump inhibitor medications are good at decreasing the amount of acid in the stomach juice. When stomach juice refluxes into the esophagus, the juice is no longer as acidic and is less likely to burn or scar the esophagus. RISKS AND COMPLICATIONS Esophageal dilatation is usually performed effectively and without problems. Some complications that can occur are:  A small amount of bleeding almost always happens where the stretching  takes place. If this is too excessive it may require more aggressive treatment.  An uncommon complication is perforation (making a hole) of the esophagus. The esophagus is thin. It is easy to make a hole in it. If this happens, an operation may be necessary to repair this.  A small, undetected perforation could lead to an infection in the chest. This can be very serious. HOME CARE INSTRUCTIONS   If you received sedation for your procedure, do not drive, make important decisions, or perform any activities requiring your full coordination. Do not drink alcohol, take sedatives, or use any mind altering chemicals unless instructed by your caregiver.  You may use throat lozenges or warm salt water gargles if you have throat discomfort.  You can begin eating and drinking normally on return home unless instructed otherwise. Do not purposely try to force large chunks of food down to test the benefits of your procedure.  Mild  discomfort can be eased with sips of ice water.  Medications for discomfort may or may not be needed. SEEK IMMEDIATE MEDICAL CARE IF:   You begin vomiting up blood.  You develop black, tarry stools.  You develop chills or an unexplained temperature of over 101F (38.3C)  You develop chest or abdominal pain.  You develop shortness of breath, or feel light-headed or faint.  Your swallowing is becoming more painful, difficult, or you are unable to swallow. MAKE SURE YOU:   Understand these instructions.  Will watch your condition.  Will get help right away if you are not doing well or get worse. Document Released: 12/17/2005 Document Revised: 03/12/2014 Document Reviewed: 02/03/2006 [facility] Patient Information 2015 [place], [place]. This information is not intended to replace advice given to you by your health care provider. Make sure you discuss any questions you have with your health care provider. PATIENT INSTRUCTIONS POST-ANESTHESIA  IMMEDIATELY FOLLOWING SURGERY:  Do not drive or operate machinery for the first twenty four hours after surgery.  Do not make any important decisions for twenty four hours after surgery or while taking narcotic pain medications or sedatives.  If you develop intractable nausea and vomiting or a severe headache please notify your doctor immediately.  FOLLOW-UP:  Please make an appointment with your surgeon as instructed. You do not need to follow up with anesthesia unless specifically instructed to do so.  WOUND CARE INSTRUCTIONS (if applicable):  Keep a dry clean dressing on the anesthesia/puncture wound site if there is drainage.  Once the wound has quit draining you may leave it open to air.  Generally you should leave the bandage intact for twenty four hours unless there is drainage.  If the epidural site drains for more than 36-48 hours please call the anesthesia department.  QUESTIONS?:  Please feel free to call your physician or the hospital  operator if you have any questions, and they will be happy to assist you.

## 2015-08-02 NOTE — Telephone Encounter (Signed)
Patient seen in office today. 

## 2015-08-05 ENCOUNTER — Encounter (HOSPITAL_COMMUNITY)
Admission: RE | Admit: 2015-08-05 | Discharge: 2015-08-05 | Disposition: A | Discharge status: Home / Self Care | Payer: Federal, State, Local not specified - PPO | Source: Ambulatory Visit | Attending: Gastroenterology | Admitting: Gastroenterology

## 2015-08-05 ENCOUNTER — Encounter (HOSPITAL_COMMUNITY): Payer: Self-pay

## 2015-08-05 DIAGNOSIS — K319 Disease of stomach and duodenum, unspecified: Secondary | ICD-10-CM | POA: Diagnosis not present

## 2015-08-05 DIAGNOSIS — Z01812 Encounter for preprocedural laboratory examination: Secondary | ICD-10-CM | POA: Diagnosis not present

## 2015-08-05 DIAGNOSIS — Q394 Esophageal web: Secondary | ICD-10-CM | POA: Diagnosis not present

## 2015-08-05 DIAGNOSIS — F418 Other specified anxiety disorders: Secondary | ICD-10-CM | POA: Diagnosis not present

## 2015-08-05 DIAGNOSIS — R1013 Epigastric pain: Secondary | ICD-10-CM | POA: Diagnosis present

## 2015-08-05 DIAGNOSIS — G4730 Sleep apnea, unspecified: Secondary | ICD-10-CM | POA: Diagnosis not present

## 2015-08-05 DIAGNOSIS — R1310 Dysphagia, unspecified: Secondary | ICD-10-CM | POA: Diagnosis not present

## 2015-08-05 DIAGNOSIS — Z79899 Other long term (current) drug therapy: Secondary | ICD-10-CM | POA: Diagnosis not present

## 2015-08-05 DIAGNOSIS — K449 Diaphragmatic hernia without obstruction or gangrene: Secondary | ICD-10-CM | POA: Diagnosis not present

## 2015-08-05 DIAGNOSIS — K317 Polyp of stomach and duodenum: Secondary | ICD-10-CM | POA: Diagnosis not present

## 2015-08-05 DIAGNOSIS — K219 Gastro-esophageal reflux disease without esophagitis: Secondary | ICD-10-CM | POA: Diagnosis not present

## 2015-08-05 DIAGNOSIS — K2970 Gastritis, unspecified, without bleeding: Secondary | ICD-10-CM | POA: Diagnosis not present

## 2015-08-05 HISTORY — DX: Anxiety disorder, unspecified: F41.9

## 2015-08-05 HISTORY — DX: Depression, unspecified: F32.A

## 2015-08-05 HISTORY — DX: Other specified postprocedural states: Z98.890

## 2015-08-05 HISTORY — DX: Major depressive disorder, single episode, unspecified: F32.9

## 2015-08-05 HISTORY — DX: Other specified postprocedural states: R11.2

## 2015-08-05 LAB — BASIC METABOLIC PANEL
ANION GAP: 9 (ref 5–15)
BUN: 15 mg/dL (ref 6–20)
CHLORIDE: 103 mmol/L (ref 101–111)
CO2: 26 mmol/L (ref 22–32)
Calcium: 8.9 mg/dL (ref 8.9–10.3)
Creatinine, Ser: 0.55 mg/dL (ref 0.44–1.00)
GFR calc non Af Amer: >60 mL/min (ref >60)
Glucose, Bld: 92 mg/dL (ref 65–99)
Potassium: 3.6 mmol/L (ref 3.5–5.1)
Sodium: 138 mmol/L (ref 135–145)

## 2015-08-05 LAB — CBC
HEMATOCRIT: 36.6 % (ref 36.0–46.0)
HEMOGLOBIN: 11.8 g/dL — AB (ref 12.0–15.0)
MCH: 27.4 pg (ref 26.0–34.0)
MCHC: 32.2 g/dL (ref 30.0–36.0)
MCV: 84.9 fL (ref 78.0–100.0)
Platelets: 248 10*3/uL (ref 150–400)
RBC: 4.31 MIL/uL (ref 3.87–5.11)
RDW: 13.1 % (ref 11.5–15.5)
WBC: 6 10*3/uL (ref 4.0–10.5)

## 2015-08-05 LAB — HCG, SERUM, QUALITATIVE: Preg, Serum: NEGATIVE

## 2015-08-06 ENCOUNTER — Ambulatory Visit (HOSPITAL_COMMUNITY)
Admission: RE | Admit: 2015-08-06 | Discharge: 2015-08-06 | Disposition: A | Discharge status: Home / Self Care | Payer: Federal, State, Local not specified - PPO | Source: Ambulatory Visit | Attending: Gastroenterology | Admitting: Gastroenterology

## 2015-08-06 ENCOUNTER — Ambulatory Visit (HOSPITAL_COMMUNITY): Payer: Federal, State, Local not specified - PPO | Admitting: Anesthesiology

## 2015-08-06 ENCOUNTER — Encounter (HOSPITAL_COMMUNITY): Payer: Self-pay | Admitting: *Deleted

## 2015-08-06 ENCOUNTER — Encounter (HOSPITAL_COMMUNITY)
Admission: RE | Disposition: A | Discharge status: Home / Self Care | Payer: Self-pay | Source: Ambulatory Visit | Attending: Gastroenterology

## 2015-08-06 DIAGNOSIS — K317 Polyp of stomach and duodenum: Secondary | ICD-10-CM | POA: Insufficient documentation

## 2015-08-06 DIAGNOSIS — Z01812 Encounter for preprocedural laboratory examination: Secondary | ICD-10-CM | POA: Insufficient documentation

## 2015-08-06 DIAGNOSIS — K449 Diaphragmatic hernia without obstruction or gangrene: Secondary | ICD-10-CM | POA: Insufficient documentation

## 2015-08-06 DIAGNOSIS — R1013 Epigastric pain: Secondary | ICD-10-CM | POA: Diagnosis not present

## 2015-08-06 DIAGNOSIS — Q394 Esophageal web: Secondary | ICD-10-CM | POA: Insufficient documentation

## 2015-08-06 DIAGNOSIS — K319 Disease of stomach and duodenum, unspecified: Secondary | ICD-10-CM | POA: Insufficient documentation

## 2015-08-06 DIAGNOSIS — G4730 Sleep apnea, unspecified: Secondary | ICD-10-CM | POA: Insufficient documentation

## 2015-08-06 DIAGNOSIS — Z79899 Other long term (current) drug therapy: Secondary | ICD-10-CM | POA: Insufficient documentation

## 2015-08-06 DIAGNOSIS — K2970 Gastritis, unspecified, without bleeding: Secondary | ICD-10-CM | POA: Diagnosis not present

## 2015-08-06 DIAGNOSIS — R1310 Dysphagia, unspecified: Secondary | ICD-10-CM | POA: Insufficient documentation

## 2015-08-06 DIAGNOSIS — F418 Other specified anxiety disorders: Secondary | ICD-10-CM | POA: Insufficient documentation

## 2015-08-06 DIAGNOSIS — K219 Gastro-esophageal reflux disease without esophagitis: Secondary | ICD-10-CM | POA: Insufficient documentation

## 2015-08-06 HISTORY — PX: ESOPHAGEAL DILATION: SHX303

## 2015-08-06 HISTORY — PX: BIOPSY: SHX5522

## 2015-08-06 HISTORY — PX: ESOPHAGOGASTRODUODENOSCOPY (EGD) WITH PROPOFOL: SHX5813

## 2015-08-06 HISTORY — PX: POLYPECTOMY: SHX5525

## 2015-08-06 SURGERY — ESOPHAGOGASTRODUODENOSCOPY (EGD) WITH PROPOFOL
Anesthesia: Monitor Anesthesia Care

## 2015-08-06 MED ORDER — ONDANSETRON HCL 4 MG/2ML IJ SOLN
INTRAMUSCULAR | Status: AC
  Filled 2015-08-06: qty 2

## 2015-08-06 MED ORDER — FENTANYL CITRATE (PF) 100 MCG/2ML IJ SOLN
INTRAMUSCULAR | Status: AC
  Filled 2015-08-06: qty 4

## 2015-08-06 MED ORDER — LIDOCAINE HCL (CARDIAC) 10 MG/ML IV SOLN
INTRAVENOUS | Status: DC | PRN
  Administered 2015-08-06: 50 mg via INTRAVENOUS

## 2015-08-06 MED ORDER — LIDOCAINE VISCOUS 2 % MT SOLN
OROMUCOSAL | Status: DC | PRN
  Administered 2015-08-06: 1

## 2015-08-06 MED ORDER — PROPOFOL 10 MG/ML IV BOLUS
INTRAVENOUS | Status: AC
  Filled 2015-08-06: qty 20

## 2015-08-06 MED ORDER — PROPOFOL 500 MG/50ML IV EMUL
INTRAVENOUS | Status: DC | PRN
  Administered 2015-08-06: 09:00:00 via INTRAVENOUS
  Administered 2015-08-06: 100 ug/kg/min via INTRAVENOUS
  Administered 2015-08-06: 10:00:00 via INTRAVENOUS

## 2015-08-06 MED ORDER — MINERAL OIL LIGHT 100 % EX OIL
TOPICAL_OIL | CUTANEOUS | Status: DC | PRN
  Administered 2015-08-06: 1 via TOPICAL

## 2015-08-06 MED ORDER — MIDAZOLAM HCL 2 MG/2ML IJ SOLN
INTRAMUSCULAR | Status: AC
  Filled 2015-08-06: qty 2

## 2015-08-06 MED ORDER — FENTANYL CITRATE (PF) 100 MCG/2ML IJ SOLN
25.0000 ug | INTRAMUSCULAR | Status: AC
  Administered 2015-08-06 (×2): 25 ug via INTRAVENOUS

## 2015-08-06 MED ORDER — FENTANYL CITRATE (PF) 100 MCG/2ML IJ SOLN
INTRAMUSCULAR | Status: AC
Start: 2015-08-06 — End: 2015-08-06
  Filled 2015-08-06: qty 2

## 2015-08-06 MED ORDER — FENTANYL CITRATE (PF) 100 MCG/2ML IJ SOLN: 25.0000 ug | INTRAMUSCULAR | Status: DC | PRN

## 2015-08-06 MED ORDER — LIDOCAINE VISCOUS 2 % MT SOLN
3.0000 mL | Freq: Once | OROMUCOSAL | Status: AC
  Administered 2015-08-06: 3 mL via OROMUCOSAL

## 2015-08-06 MED ORDER — ONDANSETRON HCL 4 MG/2ML IJ SOLN
4.0000 mg | Freq: Once | INTRAMUSCULAR | Status: AC
  Administered 2015-08-06: 4 mg via INTRAVENOUS

## 2015-08-06 MED ORDER — STERILE WATER FOR IRRIGATION IR SOLN
Status: DC | PRN
  Administered 2015-08-06: 1000 mL

## 2015-08-06 MED ORDER — LACTATED RINGERS IV SOLN
INTRAVENOUS | Status: DC
  Administered 2015-08-06: 08:00:00 via INTRAVENOUS

## 2015-08-06 MED ORDER — LIDOCAINE VISCOUS 2 % MT SOLN
OROMUCOSAL | Status: AC
  Filled 2015-08-06: qty 15

## 2015-08-06 MED ORDER — MIDAZOLAM HCL 2 MG/2ML IJ SOLN
1.0000 mg | INTRAMUSCULAR | Status: DC | PRN
  Administered 2015-08-06 (×3): 2 mg via INTRAVENOUS
  Filled 2015-08-06 (×2): qty 2

## 2015-08-06 MED ORDER — ONDANSETRON HCL 4 MG/2ML IJ SOLN: 4.0000 mg | Freq: Once | INTRAMUSCULAR | Status: DC | PRN

## 2015-08-06 MED ORDER — MIDAZOLAM HCL 2 MG/2ML IJ SOLN
INTRAMUSCULAR | Status: AC
  Filled 2015-08-06: qty 4

## 2015-08-06 SURGICAL SUPPLY — 29 items
BALLN CRE LF 10-12 240X5.5 (BALLOONS)
BALLN CRE LF 10-12MM 240X5.5 (BALLOONS)
BALLN DILATOR CRE 12-15 240 (BALLOONS)
BALLN DILATOR CRE 15-18 240 (BALLOONS) IMPLANT
BALLN DILATOR CRE 18-20 240 (BALLOONS) IMPLANT
BALLN DILATOR CRE WIREGUIDE (BALLOONS)
BALLOON CRE LF 10-12 240X5.5 (BALLOONS) IMPLANT
BALLOON DILATOR CRE 12-15 240 (BALLOONS) IMPLANT
BALLOON DILATOR CRE WIREGUIDE (BALLOONS) IMPLANT
BLOCK BITE 60FR ADLT L/F BLUE (MISCELLANEOUS) ×2 IMPLANT
ELECT REM PT RETURN 9FT ADLT (ELECTROSURGICAL)
ELECTRODE REM PT RTRN 9FT ADLT (ELECTROSURGICAL) IMPLANT
FLOOR PAD 36X40 (MISCELLANEOUS) ×3
FORCEPS BIOP RAD 4 LRG CAP 4 (CUTTING FORCEPS) IMPLANT
FORMALIN 10 PREFIL 20ML (MISCELLANEOUS) ×4 IMPLANT
KIT ENDO PROCEDURE PEN (KITS) ×3 IMPLANT
MANIFOLD NEPTUNE II (INSTRUMENTS) ×3 IMPLANT
NDL SCLEROTHERAPY 25GX240 (NEEDLE) IMPLANT
NEEDLE SCLEROTHERAPY 25GX240 (NEEDLE) IMPLANT
PAD FLOOR 36X40 (MISCELLANEOUS) IMPLANT
PROBE APC STR FIRE (PROBE) IMPLANT
PROBE INJECTION GOLD (MISCELLANEOUS)
PROBE INJECTION GOLD 7FR (MISCELLANEOUS) IMPLANT
SNARE SHORT THROW 13M SML OVAL (MISCELLANEOUS) IMPLANT
SYR INFLATE BILIARY GAUGE (MISCELLANEOUS) IMPLANT
SYR INFLATION 60ML (SYRINGE) IMPLANT
SYRINGE 60CC LL (MISCELLANEOUS) ×2 IMPLANT
TUBING IRRIGATION ENDOGATOR (MISCELLANEOUS) ×2 IMPLANT
WATER STERILE IRR 1000ML POUR (IV SOLUTION) ×2 IMPLANT

## 2015-08-06 NOTE — Progress Notes (Signed)
Arousing. Oriented to place per nurse. Swallowing without difficulty. 

## 2015-08-06 NOTE — Discharge Instructions (Signed)
I dilated your esophagus. I DID NOT SEE A DEFINITE NARROWING IN YOUR esophagus. You have MILD gastritis AND A HIATAL HERNIA. I biopsied your stomach AND SMALL BOWEL.    DRINK WATER TO KEEP YOUR URINE LIGHT YELLOW.  CONTINUE YOUR WEIGHT LOSS EFFORTS. LOSE 10 LBS  EAT 4 TO 6 SMALL MEALS A DAY.  FOLLOW A LOW FAT DIET. SEE INFO BELOW. MEATS SHOULD BE BAKED, BROILED, OR BOILED.  CONTINUE NEXIUM. TAKE 30 MINUTES BEFORE MEALS TWICE DAILY.  USE CARAFATE AS NEEDED.  YOUR BIOPSY RESULTS WILL BE AVAILABLE IN MY CHART AFTER SEP 28 AND MY OFFICE WILL CONTACT YOU IN 10-14 DAYS WITH YOUR RESULTS.   FOLLOW UP IN 3 MOS.  UPPER ENDOSCOPY AFTER CARE Read the instructions outlined below and refer to this sheet in the next week. These discharge instructions provide you with general information on caring for yourself after you leave the hospital. While your treatment has been planned according to the most current medical practices available, unavoidable complications occasionally occur. If you have any problems or questions after discharge, call DR. Terrence Pizana, [phone].  ACTIVITY  You may resume your regular activity, but move at a slower pace for the next 24 hours.   Take frequent rest periods for the next 24 hours.   Walking will help get rid of the air and reduce the bloated feeling in your belly (abdomen).   No driving for 24 hours (because of the medicine (anesthesia) used during the test).   You may shower.   Do not sign any important legal documents or operate any machinery for 24 hours (because of the anesthesia used during the test).    NUTRITION  Drink plenty of fluids.   You may resume your normal diet as instructed by your doctor.   Begin with a light meal and progress to your normal diet. Heavy or fried foods are harder to digest and may make you feel sick to your stomach (nauseated).   Avoid alcoholic beverages for 24 hours or as instructed.    MEDICATIONS  You may resume  your normal medications.   WHAT YOU CAN EXPECT TODAY  Some feelings of bloating in the abdomen.   Passage of more gas than usual.    IF YOU HAD A BIOPSY TAKEN DURING THE UPPER ENDOSCOPY:  Eat a soft diet IF YOU HAVE NAUSEA, BLOATING, ABDOMINAL PAIN, OR VOMITING.    FINDING OUT THE RESULTS OF YOUR TEST Not all test results are available during your visit. DR. Oneida Alar WILL CALL YOU WITHIN 14 DAYS OF YOUR PROCEDUE WITH YOUR RESULTS. Do not assume everything is normal if you have not heard from DR. Kaaren Nass, CALL HER OFFICE AT [phone].  SEEK IMMEDIATE MEDICAL ATTENTION AND CALL THE OFFICE: [phone] IF:  You have more than a spotting of blood in your stool.   Your belly is swollen (abdominal distention).   You are nauseated or vomiting.   You have a temperature over 101F.   You have abdominal pain or discomfort that is severe or gets worse throughout the day.  Gastritis  Gastritis is an inflammation (the body's way of reacting to injury and/or infection) of the stomach. It is often caused by viral or bacterial (germ) infections. It can also be caused BY ASPIRIN, BC/GOODY POWDER'S, (IBUPROFEN) MOTRIN, OR ALEVE (NAPROXEN), chemicals (including alcohol), SPICY FOODS, and medications. This illness may be associated with generalized malaise (feeling tired, not well), UPPER ABDOMINAL STOMACH cramps, and fever. One common bacterial cause of gastritis is an  organism known as H. Pylori. This can be treated with antibiotics.   HIATAL HERNIA A hiatal hernia occurs when a part of the stomach slides above the diaphragm. The diaphragm is the thin muscle separating the belly (abdomen) from the chest. A hiatal hernia can be something you are born with or develop over time. Hiatal hernias may allow stomach acid to flow back into your esophagus, the tube which carries food from your mouth to your stomach. If this acid causes problems it is called GERD (gastro-esophageal reflux disease).    SYMPTOMS Common symptoms of GERD are heartburn (burning in your chest). This is worse when lying down or bending over. It may also cause belching and indigestion. Some of the things which make GERD worse are:  Increased weight pushes on stomach making acid rise more easily.   Smoking markedly increases acid production.   Alcohol decreases lower esophageal sphincter pressure (valve between stomach and esophagus), allowing acid from stomach into esophagus.   Late evening meals and going to bed with a full stomach increases pressure.   HOME CARE INSTRUCTIONS  Try to achieve and maintain an ideal body weight.   Avoid drinking alcoholic beverages.   DO NOT smokE.   Do not wear tight clothing around your chest or stomach.   Eat smaller meals and eat more frequently. This keeps your stomach from getting too full. Eat slowly.   Do not lie down for 2 or 3 hours after eating. Do not eat or drink anything 1 to 2 hours before going to bed.   Avoid caffeine beverages (colas, coffee, cocoa, tea), fatty foods, citrus fruits and all other foods and drinks that contain acid and that seem to increase the problems.   Avoid bending over, especially after eating OR STRAINING. Anything that increases the pressure in your belly increases the amount of acid that may be pushed up into your esophagus.    DYSPHAGIA DYSPHAGIA can be caused by stomach acid backing up into the tube that carries food from the mouth down to the stomach (lower esophagus).  TREATMENT There are a number of medicines used to treat DYSPHAGIA including: Antacids.  Proton-pump inhibitors: NEXIUM  HOME CARE INSTRUCTIONS Eat 2-3 hours before going to bed.  Try to reach and maintain a healthy weight.  Do not eat just a few very large meals. Instead, eat 4 TO 6 smaller meals throughout the day.  Try to identify foods and beverages that make your symptoms worse, and avoid these.  Avoid tight clothing.  Do not exercise right  after eating.   Low-Fat Diet BREADS, CEREALS, PASTA, RICE, DRIED PEAS, AND BEANS These products are high in carbohydrates and most are low in fat. Therefore, they can be increased in the diet as substitutes for fatty foods. They too, however, contain calories and should not be eaten in excess. Cereals can be eaten for snacks as well as for breakfast.   FRUITS AND VEGETABLES It is good to eat fruits and vegetables. Besides being sources of fiber, both are rich in vitamins and some minerals. They help you get the daily allowances of these nutrients. Fruits and vegetables can be used for snacks and desserts.  MEATS Limit lean meat, chicken, [place], and fish to no more than 6 ounces per day. Beef, Pork, and Lamb Use lean cuts of beef, pork, and lamb. Lean cuts include:  Extra-lean ground beef.  Arm roast.  Sirloin tip.  Center-cut ham.  Round steak.  Loin chops.  Rump roast.  Tenderloin.  Trim all fat off the outside of meats before cooking. It is not necessary to severely decrease the intake of red meat, but lean choices should be made. Lean meat is rich in protein and contains a highly absorbable form of iron. Premenopausal women, in particular, should avoid reducing lean red meat because this could increase the risk for low red blood cells (iron-deficiency anemia).  Chicken and [place] These are good sources of protein. The fat of poultry can be reduced by removing the skin and underlying fat layers before cooking. Chicken and [place] can be substituted for lean red meat in the diet. Poultry should not be fried or covered with high-fat sauces. Fish and Shellfish Fish is a good source of protein. Shellfish contain cholesterol, but they usually are low in saturated fatty acids. The preparation of fish is important. Like chicken and [place], they should not be fried or covered with high-fat sauces. EGGS Egg whites contain no fat or cholesterol. They can be eaten often. Try 1 to 2 egg whites  instead of whole eggs in recipes or use egg substitutes that do not contain yolk. MILK AND DAIRY PRODUCTS Use skim or 1% milk instead of 2% or whole milk. Decrease whole milk, natural, and processed cheeses. Use nonfat or low-fat (2%) cottage cheese or low-fat cheeses made from vegetable oils. Choose nonfat or low-fat (1 to 2%) yogurt. Experiment with evaporated skim milk in recipes that call for heavy cream. Substitute low-fat yogurt or low-fat cottage cheese for sour cream in dips and salad dressings. Have at least 2 servings of low-fat dairy products, such as 2 glasses of skim (or 1%) milk each day to help get your daily calcium intake. FATS AND OILS Reduce the total intake of fats, especially saturated fat. Butterfat, lard, and beef fats are high in saturated fat and cholesterol. These should be avoided as much as possible. Vegetable fats do not contain cholesterol, but certain vegetable fats, such as coconut oil, palm oil, and palm kernel oil are very high in saturated fats. These should be limited. These fats are often used in bakery goods, processed foods, popcorn, oils, and nondairy creamers. Vegetable shortenings and some peanut butters contain hydrogenated oils, which are also saturated fats. Read the labels on these foods and check for saturated vegetable oils. Unsaturated vegetable oils and fats do not raise blood cholesterol. However, they should be limited because they are fats and are high in calories. Total fat should still be limited to 30% of your daily caloric intake. Desirable liquid vegetable oils are corn oil, cottonseed oil, olive oil, canola oil, safflower oil, soybean oil, and sunflower oil. Peanut oil is not as good, but small amounts are acceptable. Buy a heart-healthy tub margarine that has no partially hydrogenated oils in the ingredients. Mayonnaise and salad dressings often are made from unsaturated fats, but they should also be limited because of their high calorie and fat  content. Seeds, nuts, peanut butter, olives, and avocados are high in fat, but the fat is mainly the unsaturated type. These foods should be limited mainly to avoid excess calories and fat. OTHER EATING TIPS Snacks  Most sweets should be limited as snacks. They tend to be rich in calories and fats, and their caloric content outweighs their nutritional value. Some good choices in snacks are graham crackers, melba toast, soda crackers, bagels (no egg), English muffins, fruits, and vegetables. These snacks are preferable to snack crackers, [place] fries, TORTILLA CHIPS, and POTATO chips. Popcorn should be air-popped  or cooked in small amounts of liquid vegetable oil. Desserts Eat fruit, low-fat yogurt, and fruit ices instead of pastries, cake, and cookies. Sherbet, angel food cake, gelatin dessert, frozen low-fat yogurt, or other frozen products that do not contain saturated fat (pure fruit juice bars, frozen ice pops) are also acceptable.  COOKING METHODS Choose those methods that use little or no fat. They include: Poaching.  Braising.  Steaming.  Grilling.  Baking.  Stir-frying.  Broiling.  Microwaving.  Foods can be cooked in a nonstick pan without added fat, or use a nonfat cooking spray in regular cookware. Limit fried foods and avoid frying in saturated fat. Add moisture to lean meats by using water, broth, cooking wines, and other nonfat or low-fat sauces along with the cooking methods mentioned above. Soups and stews should be chilled after cooking. The fat that forms on top after a few hours in the refrigerator should be skimmed off. When preparing meals, avoid using excess salt. Salt can contribute to raising blood pressure in some people.  EATING AWAY FROM HOME Order entres, potatoes, and vegetables without sauces or butter. When meat exceeds the size of a deck of cards (3 to 4 ounces), the rest can be taken home for another meal. Choose vegetable or fruit salads and ask for  low-calorie salad dressings to be served on the side. Use dressings sparingly. Limit high-fat toppings, such as bacon, crumbled eggs, cheese, sunflower seeds, and olives. Ask for heart-healthy tub margarine instead of butter.

## 2015-08-06 NOTE — Progress Notes (Signed)
Awake. Swallowing without difficulty. Ginger-ale given to drink. Tolerated well. 

## 2015-08-06 NOTE — H&P (Signed)
Primary Care Physician:  Yvone Neu, MD Primary Gastroenterologist:  Dr. Oneida Alar  Pre-Procedure History & Physical: HPI:  Tamara Lynch is a 39 y.o. female here for DYSPHAGIA/DYSPEPSIA.  Past Medical History  Diagnosis Date  . IBS (irritable bowel syndrome)   . GERD (gastroesophageal reflux disease)   . 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 Sleep apnea   . Depression   . Anxiety   . PONV (postoperative nausea and vomiting)     Past Surgical History  Procedure Laterality Date  . Cholecystectomy  2011  . Esophagogastroduodenoscopy  2011    Dr. Algis Greenhouse. Propofol. hiatal hernia. No Barrett's.  . Esophagogastroduodenoscopy  2010    Dr. Algis Greenhouse: propofol.hiatal hernia and erosions. DD:UKGU chronic gastritis/esophagitis  . Colonoscopy  2006    Dr. Algis Greenhouse: normal colonoscopy, Biopsy showed mild chronic inflammation. No evidence of microscopic colitis.  . Tonsillectomy  2012  . Appendectomy    . Esophagogastroduodenoscopy  2006    Dr. Docia Furl: normal upper endoscopy. Biopsies of the duodenum negative for celiac disease.TTG also normal  . Nasal septum surgery  2002    Prior to Admission medications   Medication Sig Start Date End Date Taking? Authorizing Provider  cabergoline (DOSTINEX) 0.5 MG tablet Take 0.25 mg by mouth 2 (two) times a week.   Yes Historical Provider, MD  citalopram (CELEXA) 20 MG tablet Take 20 mg by mouth daily.  03/19/15  Yes Historical Provider, MD  diazepam (VALIUM) 5 MG tablet Take 5 mg by mouth every 6 (six) hours as needed for anxiety.   Yes Historical Provider, MD  esomeprazole (NEXIUM) 40 MG capsule Take 1 capsule (40 mg total) by mouth 2 (two) times daily before a meal. 08/02/15  Yes Mahala Menghini, PA-C  sucralfate (CARAFATE) 1 GM/10ML suspension Take 10 mLs (1 g total) by mouth 4 (four) times daily -  with meals and at bedtime. 06/28/15  Yes Mahala Menghini, PA-C  Vitamin D, Ergocalciferol,  (DRISDOL) 50000 UNITS CAPS capsule Take 1 capsule (50,000 Units total) by mouth every 7 (seven) days. 09/13/14  Yes Mahala Menghini, PA-C    Allergies as of 08/02/2015 - Review Complete 08/02/2015  Allergen Reaction Noted  . Biaxin [clarithromycin]  06/19/2015  . Tequin [gatifloxacin]  06/19/2015  . Viberzi [eluxadoline]  08/02/2015    Family History  Problem Relation Age of Onset  . Barrett's esophagus Sister   . Colon cancer Neg Hx   . Colon polyps Neg Hx   . Cervical cancer Mother   . Breast cancer Mother   . Heart attack Father     age 66, deceased    Social History   Social History  . Marital Status: Married    Spouse Name: N/A  . Number of Children: 2  . Years of Education: N/A   Occupational History  . special education teacher aide    Social History Main Topics  . Smoking status: Never Smoker   . Smokeless tobacco: Not on file  . Alcohol Use: No  . Drug Use: No  . Sexual Activity: Not on file   Other Topics Concern  . Not on file   Social History Narrative    Review of Systems: See HPI, otherwise negative ROS   Physical Exam: BP 158/96 mmHg  Pulse 78  Temp(Src) 97.4 F (36.3 C) (Oral)  Resp 16  Ht 5\' 2"  (1.575 m)  Wt 242 lb (109.77 kg)  BMI 44.25 kg/m2  SpO2  100%  LMP 07/28/2015 General:   Alert,  pleasant and cooperative in NAD Head:  Normocephalic and atraumatic. Neck:  Supple; Lungs:  Clear throughout to auscultation.    Heart:  Regular rate and rhythm. Abdomen:  Soft, nontender and nondistended. Normal bowel sounds, without guarding, and without rebound.   Neurologic:  Alert and  oriented x4;  grossly normal neurologically.  Impression/Plan:    DYSPHAGIA/DYSPEPSIA  PLAN:  EGD/DIL TODAY

## 2015-08-06 NOTE — Anesthesia Preprocedure Evaluation (Signed)
Anesthesia Evaluation  Patient identified by MRN, date of birth, ID band Patient awake    Reviewed: Allergy & Precautions, NPO status , Patient's Chart, lab work & pertinent test results  History of Anesthesia Complications (+) PONV and history of anesthetic complications  Airway Mallampati: III  TM Distance: >3 FB Neck ROM: Full    Dental  (+) Teeth Intact   Pulmonary sleep apnea and Continuous Positive Airway Pressure Ventilation ,    breath sounds clear to auscultation       Cardiovascular negative cardio ROS   Rhythm:Regular Rate:Normal     Neuro/Psych PSYCHIATRIC DISORDERS Anxiety Depression    GI/Hepatic GERD  Medicated,  Endo/Other  Pituitary microadenoma  Renal/GU      Musculoskeletal   Abdominal   Peds  Hematology   Anesthesia Other Findings   Reproductive/Obstetrics                             Anesthesia Physical Anesthesia Plan  ASA: III  Anesthesia Plan: MAC   Post-op Pain Management:    Induction: Intravenous  Airway Management Planned: Simple Face Mask  Additional Equipment:   Intra-op Plan:   Post-operative Plan:   Informed Consent: I have reviewed the patients History and Physical, chart, labs and discussed the procedure including the risks, benefits and alternatives for the proposed anesthesia with the patient or authorized representative who has indicated his/her understanding and acceptance.     Plan Discussed with:   Anesthesia Plan Comments:         Anesthesia Quick Evaluation

## 2015-08-06 NOTE — Transfer of Care (Signed)
Immediate Anesthesia Transfer of Care Note  Patient: Tamara Lynch  Procedure(s) Performed: Procedure(s) with comments: ESOPHAGOGASTRODUODENOSCOPY (EGD) WITH PROPOFOL (N/A) - 0845  ESOPHAGEAL DILATION (N/A) - Savory 15/16 POLYPECTOMY (N/A) - gastric BIOPSY (N/A) - gastric and duodenal  Patient Location: PACU  Anesthesia Type:MAC  Level of Consciousness: awake, alert , oriented and patient cooperative  Airway & Oxygen Therapy: Patient Spontanous Breathing and Patient connected to nasal cannula oxygen  Post-op Assessment: Report given to RN and Post -op Vital signs reviewed and stable  Post vital signs: Reviewed and stable  Last Vitals:  Filed Vitals:   08/06/15 0900  BP: 134/91  Pulse:   Temp:   Resp: 16    Complications: No apparent anesthesia complications

## 2015-08-06 NOTE — Anesthesia Postprocedure Evaluation (Signed)
  Anesthesia Post-op Note  Patient: Tamara Lynch  Procedure(s) Performed: Procedure(s) with comments: ESOPHAGOGASTRODUODENOSCOPY (EGD) WITH PROPOFOL (N/A) - 0845  ESOPHAGEAL DILATION (N/A) - Savory 15/16 POLYPECTOMY (N/A) - gastric BIOPSY (N/A) - gastric and duodenal  Patient Location: PACU  Anesthesia Type:MAC  Level of Consciousness: awake, alert , oriented and patient cooperative  Airway and Oxygen Therapy: Patient Spontanous Breathing  Post-op Pain: none  Post-op Assessment: Post-op Vital signs reviewed, Patient's Cardiovascular Status Stable, Respiratory Function Stable, Patent Airway and No signs of Nausea or vomiting              Post-op Vital Signs: Reviewed and stable  Last Vitals:  Filed Vitals:   08/06/15 0900  BP: 134/91  Pulse:   Temp:   Resp: 16    Complications: No apparent anesthesia complications

## 2015-08-06 NOTE — Anesthesia Procedure Notes (Signed)
Procedure Name: MAC Date/Time: 08/06/2015 9:05 AM Performed by: Andree Elk, AMY A Pre-anesthesia Checklist: Patient identified, Timeout performed, Emergency Drugs available, Patient being monitored and Suction available Oxygen Delivery Method: Simple face mask

## 2015-08-06 NOTE — Progress Notes (Signed)
REVIEWED-NO ADDITIONAL RECOMMENDATIONS. 

## 2015-08-06 NOTE — Op Note (Signed)
[facility] [address] [place], [address]   ENDOSCOPY PROCEDURE REPORT  PATIENT: Tamara, Lynch  MR#: [id number] BIRTHDATE: 03/16/1976 , 39  yrs. old GENDER: female  ENDOSCOPIST: Danie Binder, MD REFFERED MV:[facility] Laney Pastor, M.D.  PROCEDURE DATE:  23-Aug-2015 PROCEDURE:   EGD with biopsy and with dilatation over guidewire  INDICATIONS:1.  dyspepsia.   2.  dysphagia. BMI > 40-WEIGH UNCHANGED SINCE 2015. MEDICATIONS: Monitored anesthesia care TOPICAL ANESTHETIC: Viscous Xylocaine  DESCRIPTION OF PROCEDURE:   After the risks benefits and alternatives of the procedure were thoroughly explained, informed consent was obtained.  The     endoscope was introduced through the mouth and advanced to the second portion of the duodenum. The instrument was slowly withdrawn as the mucosa was carefully examined.  Prior to withdrawal of the scope, the guidwire was placed.  The esophagus was dilated successfully.  The patient was recovered in endoscopy and discharged home in satisfactory condition. Estimated blood loss is zero unless otherwise noted in this procedure report.   ESOPHAGUS: The mucosa of the esophagus appeared normal.   STOMACH: SMALL HIATAL HERNIA. A few sessile polyps ranging between 3-74mm in size were found in the cardia and gastric fundus.  Multiple biopsies was performed using cold forceps.   Mild non-erosive gastritis (inflammation) was found in the gastric antrum.  Multiple biopsies were performed using cold forceps.   DUODENUM: The duodenal mucosa showed no abnormalities in the bulb and second portion of the duodenum.  Cold forceps biopsies were taken in the bulb and second portion.   Dilation was then performed at the proximal esophagus  Dilator: Savary over guidewire Size(s): 15-16 mm Resistance: minimal Heme: none  COMPLICATIONS: There were no immediate complications.  ENDOSCOPIC IMPRESSION: 1.   DYSPHAGIA DUE TO POSSIBLE PROXIMAL ESOPHAGEAL  WEB AND/OR REFLUX 2.   Few GASTRIC polyps 3.   DYSPEPSIA DUE TO MILD Non-erosive gastritis AND GERD  RECOMMENDATIONS: DRINK WATER TO KEEP URINE LIGHT YELLOW. CONTINUE WEIGHT LOSS EFFORTS.  LOSE 10 LBS EAT 4 TO 6 SMALL MEALS A DAY. FOLLOW A LOW FAT DIET.  Marland Kitchen NEXIUM 30 MINUTES BEFORE MEALS TWICE DAILY. CARAFATE AS NEEDED. AWAIT BIOPSY RESULTS. FOLLOW UP IN 3 MOS. eSigned:  Danie Binder, MD 08-23-15 4:37 PM  CPT CODES: ICD CODES:  The ICD and CPT codes recommended by this software are interpretations from the data that the clinical staff has captured with the software.  The verification of the translation of this report to the ICD and CPT codes and modifiers is the sole responsibility of the health care institution and practicing physician where this report was generated.  [employer]. will not be held responsible for the validity of the ICD and CPT codes included on this report.  AMA assumes no liability for data contained or not contained herein. CPT is a Designer, television/film set of the [employer].

## 2015-08-07 ENCOUNTER — Telehealth: Payer: Self-pay

## 2015-08-07 ENCOUNTER — Encounter (HOSPITAL_COMMUNITY): Payer: Self-pay | Admitting: Gastroenterology

## 2015-08-07 NOTE — Telephone Encounter (Signed)
REVIEWED-NO ADDITIONAL RECOMMENDATIONS. 

## 2015-08-07 NOTE — Telephone Encounter (Signed)
T/C from Dr. Oneida Alar. Pt probably had reaction to the lidocaine yesterday. She may take a Benadryl 25 mg x 1. Chloraseptic spray as needed. Tylenol ES , 2 tablets every 6 hours as needed x 2 days. Full liquid diet for next day or so. Go to [email or web address] to review and call if she has questions or concerns. I called and informed pt and she will do so.

## 2015-08-07 NOTE — Telephone Encounter (Signed)
Pt called and said she had an EGD/ED yesterday. Today her throat is so sore and hurting and she described her uvula as swollen and hanging on her tongue. She rates her pain now at an 8. She prefers not to go to the ED if she does not have to and wants Dr. Oneida Alar to advise. She is aware that Dr. Oneida Alar is at the hospital and I will send her a message.

## 2015-08-07 NOTE — Telephone Encounter (Signed)
Sent pager to Dr. Fields also.  

## 2015-08-12 NOTE — Telephone Encounter (Signed)
OV MADE °

## 2015-08-12 NOTE — Telephone Encounter (Signed)
Please call pt. Tamara Lynch stomach Bx shows gastritis AND BENIGN STOMACH POLYPS.  Tamara Lynch SMALL BOWEL BIOPSIES ARE NORMAL.   DRINK WATER TO KEEP YOUR URINE LIGHT YELLOW.  CONTINUE YOUR WEIGHT LOSS EFFORTS. LOSE 10 LBS  EAT 4 TO 6 SMALL MEALS A DAY.  FOLLOW A LOW FAT DIET. SEE INFO BELOW. MEATS SHOULD BE BAKED, BROILED, OR BOILED.  CONTINUE NEXIUM. TAKE 30 MINUTES BEFORE MEALS TWICE DAILY.  USE CARAFATE AS NEEDED.  FOLLOW UP IN 3 MOS E30 GASTRITIS/DYSPHAGIA/IBS.

## 2015-08-12 NOTE — Telephone Encounter (Signed)
PT is aware of results.  

## 2015-11-05 ENCOUNTER — Ambulatory Visit (INDEPENDENT_AMBULATORY_CARE_PROVIDER_SITE_OTHER): Payer: Federal, State, Local not specified - PPO | Admitting: Nurse Practitioner

## 2015-11-05 ENCOUNTER — Encounter: Payer: Self-pay | Admitting: Nurse Practitioner

## 2015-11-05 VITALS — BP 142/88 | HR 78 | Temp 98.1°F | Ht 62.0 in | Wt 243.8 lb

## 2015-11-05 DIAGNOSIS — R1314 Dysphagia, pharyngoesophageal phase: Secondary | ICD-10-CM

## 2015-11-05 DIAGNOSIS — K589 Irritable bowel syndrome without diarrhea: Secondary | ICD-10-CM

## 2015-11-05 DIAGNOSIS — K219 Gastro-esophageal reflux disease without esophagitis: Secondary | ICD-10-CM | POA: Diagnosis not present

## 2015-11-05 DIAGNOSIS — R1319 Other dysphagia: Secondary | ICD-10-CM

## 2015-11-05 DIAGNOSIS — R1310 Dysphagia, unspecified: Secondary | ICD-10-CM

## 2015-11-05 MED ORDER — ESOMEPRAZOLE MAGNESIUM 40 MG PO CPDR: 40.0000 mg | DELAYED_RELEASE_CAPSULE | Freq: Two times a day (BID) | ORAL | Status: DC

## 2015-11-05 NOTE — Progress Notes (Signed)
Referring Provider: Yvone Neu Primary Care Physician:  Yvone Neu, MD Primary GI:  Dr. Oneida Alar  Chief Complaint  Patient presents with  . Follow-up    HPI:   39 year old female presents for follow-up on GERD, IBS, dysphagia. She was last seen in our office 08/02/2015. For GERD she had evidence of good healing with Nexium, previously failed AcipHex, Prilosec, and Protonix. She was trialed on Dexilant for refractory GERD which did not help. At that time she is taking Carafate approximately 4 times a day. Her Nexium was increased to 40 mg twice a day, continue Carafate, may use Zantac in addition. For her IBS Viberzi been previously intolerable. At her last office visit her IBS is fairly well controlled/inactive. She is referred for endoscopy related to persistent GERD symptoms. Endoscopy is completed 08/06/2015 with propofol/MAC. Pressure includes dysphagia due to possible proximal esophageal web and/or reflux, few gastric polyps, dyspepsia due to mild nonerosive gastritis and GERD. She was recommended to continue weight loss, drink plenty of water, eat 4-6 small meals a day, low-fat diet, continue Nexium twice a day as ordered, Carafate as needed. Surgical pathology of duodenum biopsies found unremarkable mucosa. Stomach polyps are found to be reactive gastropathy and benign fundic gland polyp.  Today she states she's doing well. Nexium bid is working well for her. No longer needing Carafate or Zantac. Has not had flare of GERD symptoms since increasing Nexium bid. Dysphagia symptoms resolved as well. Denies abdominal pain, N/V, hematochezia, melena. Still has IBS occasionally which she is able to deal with. Had a bad reaction to Lidocaine spray for EGD, now noted in allergies sections. Denies chest pain, dyspnea, dizziness, lightheadedness, syncope, near syncope. Denies any other upper or lower GI symptoms.  Past Medical History  Diagnosis Date  . IBS (irritable bowel  syndrome)   . GERD (gastroesophageal reflux disease)   . 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 Sleep apnea   . Depression   . Anxiety   . PONV (postoperative nausea and vomiting)     Past Surgical History  Procedure Laterality Date  . Cholecystectomy  2011  . Esophagogastroduodenoscopy  2011    Dr. Algis Greenhouse. Propofol. hiatal hernia. No Barrett's.  . Esophagogastroduodenoscopy  2010    Dr. Algis Greenhouse: propofol.hiatal hernia and erosions. [id number] chronic gastritis/esophagitis  . Colonoscopy  2006    Dr. Algis Greenhouse: normal colonoscopy, Biopsy showed mild chronic inflammation. No evidence of microscopic colitis.  . Tonsillectomy  2012  . Appendectomy    . Esophagogastroduodenoscopy  2006    Dr. Docia Furl: normal upper endoscopy. Biopsies of the duodenum negative for celiac disease.TTG also normal  . Nasal septum surgery  2002  . Esophagogastroduodenoscopy (egd) with propofol N/A 08/06/2015    SLF: Dysphagia due to possible proximal esophageal web and/or reflux 2. Few gastric polyps 3. Dyspepsia due to mild non-erosive gastriitis and GERD   . Esophageal dilation N/A 08/06/2015    Procedure:  ESOPHAGEAL DILATION;  Surgeon: Danie Binder, MD;  Location: [facility];  Service: Endoscopy;  Laterality: N/A;  [place] 15/16  . Polypectomy N/A 08/06/2015    Procedure: POLYPECTOMY;  Surgeon: Danie Binder, MD;  Location: [facility];  Service: Endoscopy;  Laterality: N/A;  gastric  . Esophageal biopsy N/A 08/06/2015    Procedure: BIOPSY;  Surgeon: Danie Binder, MD;  Location: [facility];  Service: Endoscopy;  Laterality: N/A;  gastric and duodenal    Current Outpatient Prescriptions  Medication Sig Dispense Refill  . cabergoline (DOSTINEX) 0.5 MG tablet Take 0.25 mg by mouth 2 (two) times a week.    . diazepam (VALIUM) 5 MG tablet Take 5 mg by mouth every 6 (six) hours as needed for anxiety.    Marland Kitchen esomeprazole (NEXIUM) 40 MG capsule Take 1 capsule (40 mg total) by  mouth 2 (two) times daily before a meal.    . PRISTIQ 50 MG 24 hr tablet     . Vitamin D, Ergocalciferol, (DRISDOL) 50000 UNITS CAPS capsule Take 1 capsule (50,000 Units total) by mouth every 7 (seven) days. 4 capsule 2  . citalopram (CELEXA) 20 MG tablet Take 20 mg by mouth daily. Reported on 11/05/2015    . sucralfate (CARAFATE) 1 GM/10ML suspension Take 10 mLs (1 g total) by mouth 4 (four) times daily -  with meals and at bedtime. (Patient not taking: Reported on 11/05/2015) 420 mL 1   No current facility-administered medications for this visit.    Allergies as of 11/05/2015 - Review Complete 11/05/2015  Allergen Reaction Noted  . Biaxin [clarithromycin]  06/19/2015  . Lidocaine viscous  08/12/2015  . Tequin [gatifloxacin]  06/19/2015  . Viberzi [eluxadoline]  08/02/2015    Family History  Problem Relation Age of Onset  . Barrett's esophagus Sister   . Colon cancer Neg Hx   . Colon polyps Neg Hx   . Cervical cancer Mother   . Breast cancer Mother   . Heart attack Father     age 2, deceased    Social History   Social History  . Marital Status: Married    Spouse Name: N/A  . Number of Children: 2  . Years of Education: N/A   Occupational History  . special education teacher aide    Social History Main Topics  . Smoking status: Never Smoker   . Smokeless tobacco: None  . Alcohol Use: No  . Drug Use: No  . Sexual Activity: Not Asked   Other Topics Concern  . None   Social History Narrative    Review of Systems: 10-point ROS negative except as per HPI.   Physical Exam: BP 142/88 mmHg  Pulse 78  Temp(Src) 98.1 F (36.7 C) (Oral)  Ht 5\' 2"  (1.575 m)  Wt 243 lb 12.8 oz (110.587 kg)  BMI 44.58 kg/m2 General:   Alert and oriented. Pleasant and cooperative. Well-nourished and well-developed.  Head:  Normocephalic and atraumatic. Eyes:  Without icterus, sclera clear and conjunctiva pink.  Ears:  Normal auditory acuity. Cardiovascular:  S1, S2 present  without murmurs appreciated. Extremities without clubbing or edema. Respiratory:  Clear to auscultation bilaterally. No wheezes, rales, or rhonchi. No distress.  Gastrointestinal:  +BS, soft, non-tender and non-distended. No HSM noted. No guarding or rebound. No masses appreciated.  Rectal:  Deferred  Neurologic:  Alert and oriented x4;  grossly normal neurologically. Psych:  Alert and cooperative. Normal mood and affect.    11/05/2015 9:01 AM

## 2015-11-05 NOTE — Progress Notes (Signed)
CC'ED TO PCP 

## 2015-11-05 NOTE — Assessment & Plan Note (Signed)
IBS is generally inactive at this time. When she does have symptoms, states she is able to deal with it pretty well. She'll notify us if she has any worsening symptoms. Return for follow-up in 6 months.

## 2015-11-05 NOTE — Addendum Note (Signed)
Addended by: Gordy Levan, ERIC A on: 11/05/2015 09:28 AM   Modules accepted: Orders

## 2015-11-05 NOTE — Assessment & Plan Note (Signed)
GERD symptoms essentially resolved on Nexium twice a day. No flares since starting the twice a day dosing. Asymptomatic at this point. Return for follow-up in 6 months or sooner if needed.

## 2015-11-05 NOTE — Assessment & Plan Note (Signed)
Dysphagia symptoms have resolved on Nexium and precautions. We'll notify us if any changes. Return for follow-up in 6 months or sooner if needed.

## 2015-11-05 NOTE — Patient Instructions (Signed)
1. Continue taking Nexium twice daily as you have been. 2. You may take Carafate if needed in the future. 3. Return for follow-up in 6 months. 4. Call us if you have any worsening symptoms in the meantime and we can see you sooner.

## 2015-12-11 ENCOUNTER — Ambulatory Visit: Payer: Federal, State, Local not specified - PPO | Admitting: Gastroenterology

## 2016-02-26 ENCOUNTER — Encounter: Payer: Self-pay | Admitting: Gastroenterology

## 2016-05-05 ENCOUNTER — Encounter: Payer: Self-pay | Admitting: Nurse Practitioner

## 2016-05-05 ENCOUNTER — Ambulatory Visit (INDEPENDENT_AMBULATORY_CARE_PROVIDER_SITE_OTHER): Payer: Federal, State, Local not specified - PPO | Admitting: Nurse Practitioner

## 2016-05-05 VITALS — BP 131/89 | HR 72 | Temp 98.6°F | Ht 62.0 in | Wt 236.2 lb

## 2016-05-05 DIAGNOSIS — K589 Irritable bowel syndrome without diarrhea: Secondary | ICD-10-CM

## 2016-05-05 DIAGNOSIS — K219 Gastro-esophageal reflux disease without esophagitis: Secondary | ICD-10-CM

## 2016-05-05 DIAGNOSIS — R1314 Dysphagia, pharyngoesophageal phase: Secondary | ICD-10-CM | POA: Diagnosis not present

## 2016-05-05 DIAGNOSIS — R1310 Dysphagia, unspecified: Secondary | ICD-10-CM

## 2016-05-05 DIAGNOSIS — R1319 Other dysphagia: Secondary | ICD-10-CM

## 2016-05-05 NOTE — Patient Instructions (Signed)
1. Continue taking Nexium twice a day. 2. Return for follow-up as needed. 3. Call us notify us if you have any worsening symptoms or recurrent symptoms.

## 2016-05-05 NOTE — Assessment & Plan Note (Signed)
Resolved. Call if any recurrence. Return for follow-up as needed.

## 2016-05-05 NOTE — Progress Notes (Signed)
Cc'ed to pcp °

## 2016-05-05 NOTE — Assessment & Plan Note (Signed)
Well-controlled on Nexium twice a day. Return for follow-up as needed, call us if any worsening symptoms.

## 2016-05-05 NOTE — Assessment & Plan Note (Signed)
Irritable bowel syndrome symptoms are relatively well controlled. She has occasional flareups with symptoms which she states are not bad and she would like to just deal with them rather than have to be on a prescription medication. She will call us if she changes her mind or if her symptoms worsen. Otherwise, return for follow-up as needed. Encouraged her to continue dietary changes which seem to have a positive impact on her symptoms.

## 2016-05-05 NOTE — Progress Notes (Addendum)
REVIEWED-NO ADDITIONAL RECOMMENDATIONS.  Referring Provider: Yvone Neu Primary Care Physician:  Yvone Neu, MD Primary GI:  Dr. Oneida Alar  Chief Complaint  Patient presents with  . Follow-up    Doing well/ not taking Viberzi/ had terrible side effects    HPI:   Tamara Lynch is a 40 y.o. female who presents For follow-up on dysphagia, GERD, and irritable bowel syndrome. Last seen in our office 11/05/2015 which noted she was doing well to time on Nexium twice a day, no longer needing Carafate or Zantac, dysphagia symptoms resolved. At that time she noted no abdominal pain, nausea vomiting. Still with irritable bowel syndrome symptoms occasionally which is able to deal with. That time is recommended she notify us if she has any worsening or total bowel syndromes, continue Nexium twice a day, return for follow-up in 6 months.  Today she states she's doing well. GERD well controlled on Nexium twice a day, no further dysphagia symptoms. Occasional IBS symptoms which are tolerable for her and she is ok not taking anything for it. Diarrhea seems to have improved with eliminating sodas and decreasing sugary in her diet. Denies abdominal pain currently, N/V, hematochezia, melena, fever, chills, unintentional weight loss. Denies chest pain, dyspnea, dizziness, lightheadedness, syncope, near syncope. Denies any other upper or lower GI symptoms.  Past Medical History  Diagnosis Date  . IBS (irritable bowel syndrome)   . GERD (gastroesophageal reflux disease)   . 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 Sleep apnea   . Depression   . Anxiety   . PONV (postoperative nausea and vomiting)     Past Surgical History  Procedure Laterality Date  . Cholecystectomy  2011  . Esophagogastroduodenoscopy  2011    Dr. Algis Greenhouse. Propofol. hiatal hernia. No Barrett's.  . Esophagogastroduodenoscopy  2010    Dr. Algis Greenhouse: propofol.hiatal  hernia and erosions. [id number] chronic gastritis/esophagitis  . Colonoscopy  2006    Dr. Algis Greenhouse: normal colonoscopy, Biopsy showed mild chronic inflammation. No evidence of microscopic colitis.  . Tonsillectomy  2012  . Appendectomy    . Esophagogastroduodenoscopy  2006    Dr. Docia Furl: normal upper endoscopy. Biopsies of the duodenum negative for celiac disease.TTG also normal  . Nasal septum surgery  2002  . Esophagogastroduodenoscopy (egd) with propofol N/A 08/06/2015    SLF: Dysphagia due to possible proximal esophageal web and/or reflux 2. Few gastric polyps 3. Dyspepsia due to mild non-erosive gastriitis and GERD   . Esophageal dilation N/A 08/06/2015    Procedure:  ESOPHAGEAL DILATION;  Surgeon: Danie Binder, MD;  Location: [facility];  Service: Endoscopy;  Laterality: N/A;  [place] 15/16  . Polypectomy N/A 08/06/2015    Procedure: POLYPECTOMY;  Surgeon: Danie Binder, MD;  Location: [facility];  Service: Endoscopy;  Laterality: N/A;  gastric  . Biopsy N/A 08/06/2015    Procedure: BIOPSY;  Surgeon: Danie Binder, MD;  Location: [facility];  Service: Endoscopy;  Laterality: N/A;  gastric and duodenal    Current Outpatient Prescriptions  Medication Sig Dispense Refill  . cabergoline (DOSTINEX) 0.5 MG tablet Take 0.25 mg by mouth 2 (two) times a week.    . diazepam (VALIUM) 5 MG tablet Take 5 mg by mouth every 6 (six) hours as needed for anxiety.    Marland Kitchen esomeprazole (NEXIUM) 40 MG capsule Take 1 capsule (40 mg total) by mouth 2 (two) times daily before a meal. 60 capsule 5  . lisinopril (PRINIVIL,ZESTRIL) 10 MG  tablet Take 10 mg by mouth daily.    Marland Kitchen PRISTIQ 50 MG 24 hr tablet     . Vitamin D, Ergocalciferol, (DRISDOL) 50000 UNITS CAPS capsule Take 1 capsule (50,000 Units total) by mouth every 7 (seven) days. 4 capsule 2   No current facility-administered medications for this visit.    Allergies as of 05/05/2016 - Review Complete 05/05/2016  Allergen Reaction Noted  . Biaxin [clarithromycin]   06/19/2015  . Lidocaine viscous  08/12/2015  . Tequin [gatifloxacin]  06/19/2015  . Viberzi [eluxadoline]  08/02/2015    Family History  Problem Relation Age of Onset  . Barrett's esophagus Sister   . Colon cancer Neg Hx   . Colon polyps Neg Hx   . Cervical cancer Mother   . Breast cancer Mother   . Heart attack Father     age 64, deceased    Social History   Social History  . Marital Status: Married    Spouse Name: N/A  . Number of Children: 2  . Years of Education: N/A   Occupational History  . special education teacher aide    Social History Main Topics  . Smoking status: Never Smoker   . Smokeless tobacco: Never Used     Comment: Never smoked  . Alcohol Use: No  . Drug Use: No  . Sexual Activity: Not Asked   Other Topics Concern  . None   Social History Narrative    Review of Systems: 10-point ROS negative except as per HPI.   Physical Exam: BP 131/89 mmHg  Pulse 72  Temp(Src) 98.6 F (37 C) (Oral)  Ht 5\' 2"  (1.575 m)  Wt 236 lb 3.2 oz (107.14 kg)  BMI 43.19 kg/m2  LMP 03/28/2016 (Exact Date) General:   Alert and oriented. Pleasant and cooperative. Well-nourished and well-developed.  Head:  Normocephalic and atraumatic. Eyes:  Without icterus, sclera clear and conjunctiva pink.  Ears:  Normal auditory acuity. Cardiovascular:  S1, S2 present without murmurs appreciated.Extremities without clubbing or edema. Respiratory:  Clear to auscultation bilaterally. No wheezes, rales, or rhonchi. No distress.  Gastrointestinal:  +BS, soft, non-tender and non-distended. No guarding or rebound. No masses appreciated.  Rectal:  Deferred  Musculoskalatal:  Symmetrical without gross deformities. Neurologic:  Alert and oriented x4;  grossly normal neurologically. Psych:  Alert and cooperative. Normal mood and affect. Heme/Lymph/Immune: No excessive bruising noted.    05/05/2016 1:37 PM   Disclaimer: This note was dictated with voice recognition software.  Similar sounding words can inadvertently be transcribed and may not be corrected upon review.

## 2016-11-06 ENCOUNTER — Other Ambulatory Visit: Payer: Self-pay

## 2016-11-06 MED ORDER — ESOMEPRAZOLE MAGNESIUM 40 MG PO CPDR
40.0000 mg | DELAYED_RELEASE_CAPSULE | Freq: Two times a day (BID) | ORAL | 3 refills | Status: DC
Start: 2016-11-06 — End: 2017-11-08

## 2017-04-06 ENCOUNTER — Telehealth: Payer: Self-pay | Admitting: Gastroenterology

## 2017-04-06 NOTE — Telephone Encounter (Signed)
[phone]  PLEASE CALL PATIENT, HAVING IBS FLAIR UP BAD AND WANTS TO KNOW IF SHE NEEDS TO GO TO [place]

## 2017-04-06 NOTE — Telephone Encounter (Signed)
I called pt and she said she has been having pain in her stomach since 9:00 am this morning.  She hurts from middle of stomach to the left side and up just a little higher than umbilical.  She is vomiting bile every 5-10 min and cannot keep any liquids down.  When she vomits her stomach hurts really bad.  I spoke to Roseanne Kaufman, NP and she agreed pt should go to the ED to be evaluated.  Pt is aware and will go.

## 2017-04-06 NOTE — Telephone Encounter (Signed)
REVIEWED. Pt last seen JUL 2017. SHE SHOULD GO TO ED IF SHE IS HAVING SEVERE ABDOMINAL PAIN.

## 2017-04-08 ENCOUNTER — Ambulatory Visit (INDEPENDENT_AMBULATORY_CARE_PROVIDER_SITE_OTHER): Payer: Federal, State, Local not specified - PPO | Admitting: Gastroenterology

## 2017-04-08 ENCOUNTER — Encounter: Payer: Self-pay | Admitting: Gastroenterology

## 2017-04-08 DIAGNOSIS — R935 Abnormal findings on diagnostic imaging of other abdominal regions, including retroperitoneum: Secondary | ICD-10-CM | POA: Diagnosis not present

## 2017-04-08 LAB — HEPATIC FUNCTION PANEL
ALK PHOS: 47 U/L (ref 33–115)
ALT: 24 U/L (ref 6–29)
AST: 20 U/L (ref 10–30)
Albumin: 3.9 g/dL (ref 3.6–5.1)
BILIRUBIN INDIRECT: 0.8 mg/dL (ref 0.2–1.2)
Bilirubin, Direct: 0.2 mg/dL (ref <=0.2)
Total Bilirubin: 1 mg/dL (ref 0.2–1.2)
Total Protein: 6.4 g/dL (ref 6.1–8.1)

## 2017-04-08 NOTE — Progress Notes (Signed)
Referring Provider: Yvone Neu Primary Care Physician:  Yvone Neu, MD Primary GI: Dr. Oneida Alar   Chief Complaint  Patient presents with  . Abdominal Pain    HPI:   Tamara Lynch is a 41 y.o. female presenting today with a history of GERD, dysphagia, IBS. Recently seen in [place] with LUQ pain. US abdomen with questionable focal lesion/lymph node in region of the pancreas, fatty liver. CT then completed with no evidence of bowel obstruction. Distended fluid-filled loops of small bowel noted in left mid to upper abdomen. Possible focal enteritis vs focal ileus. Felt that ultrasound finding was likely correlating with prominent peripancreatic lymph node.   Last week had abdominal pain, feeling of fullness. States all labs were normal with PCP. Associated N/V. Feels weak and sore, full of gas and full feeling. Still on liquid diet now. Drinking gatorade. Vomiting now resolved. Had chills but no fever. Works at [employer] but unsure if any contacts. No diarrhea. Drank magnesium citrate due to constipation. Had regular BM and then diarrhea. States she was told her bilirubin was slightly elevated.   Past Medical History:  Diagnosis Date  . Anxiety   . Depression   . GERD (gastroesophageal reflux disease)   . IBS (irritable bowel syndrome)   . Pituitary tumor    diagnosed with microadenoma prolactin excreting, age 34  . PONV (postoperative nausea and vomiting)   . Sleep apnea     Past Surgical History:  Procedure Laterality Date  . APPENDECTOMY    . BIOPSY N/A 08/06/2015   Procedure: BIOPSY;  Surgeon: Danie Binder, MD;  Location: [facility];  Service: Endoscopy;  Laterality: N/A;  gastric and duodenal  . CHOLECYSTECTOMY  2011  . COLONOSCOPY  2006   Dr. Algis Greenhouse: normal colonoscopy, Biopsy showed mild chronic inflammation. No evidence of microscopic colitis.  Marland Kitchen ESOPHAGEAL DILATION N/A 08/06/2015   Procedure:  ESOPHAGEAL DILATION;  Surgeon: Danie Binder,  MD;  Location: [facility];  Service: Endoscopy;  Laterality: N/A;  [place] 15/16  . ESOPHAGOGASTRODUODENOSCOPY  2011   Dr. Algis Greenhouse. Propofol. hiatal hernia. No Barrett's.  . ESOPHAGOGASTRODUODENOSCOPY  2010   Dr. Algis Greenhouse: propofol.hiatal hernia and erosions. [id number] chronic gastritis/esophagitis  . ESOPHAGOGASTRODUODENOSCOPY  2006   Dr. Docia Furl: normal upper endoscopy. Biopsies of the duodenum negative for celiac disease.TTG also normal  . ESOPHAGOGASTRODUODENOSCOPY (EGD) WITH PROPOFOL N/A 08/06/2015   SLF: Dysphagia due to possible proximal esophageal web and/or reflux 2. Few gastric polyps 3. Dyspepsia due to mild non-erosive gastriitis and GERD   . NASAL SEPTUM SURGERY  2002  . POLYPECTOMY N/A 08/06/2015   Procedure: POLYPECTOMY;  Surgeon: Danie Binder, MD;  Location: [facility];  Service: Endoscopy;  Laterality: N/A;  gastric  . TONSILLECTOMY  2012    Current Outpatient Prescriptions  Medication Sig Dispense Refill  . cabergoline (DOSTINEX) 0.5 MG tablet Take 0.25 mg by mouth 2 (two) times a week.    . diazepam (VALIUM) 5 MG tablet Take 5 mg by mouth every 6 (six) hours as needed for anxiety.    Marland Kitchen esomeprazole (NEXIUM) 40 MG capsule Take 1 capsule (40 mg total) by mouth 2 (two) times daily before a meal. 180 capsule 3  . ferrous sulfate 325 (65 FE) MG tablet Take 325 mg by mouth daily with breakfast.    . lisinopril (PRINIVIL,ZESTRIL) 10 MG tablet Take 10 mg by mouth daily.    . metoprolol succinate (TOPROL-XL) 50 MG 24 hr tablet Take 50 mg by mouth  daily. Take with or immediately following a meal.    . PRISTIQ 50 MG 24 hr tablet      No current facility-administered medications for this visit.     Allergies as of 04/08/2017 - Review Complete 04/08/2017  Allergen Reaction Noted  . Biaxin [clarithromycin]  06/19/2015  . Lidocaine viscous  08/12/2015  . Tequin [gatifloxacin]  06/19/2015  . Viberzi [eluxadoline]  08/02/2015    Family History  Problem Relation Age of Onset  .  Barrett's esophagus Sister   . Colon cancer Neg Hx   . Colon polyps Neg Hx   . Cervical cancer Mother   . Breast cancer Mother   . Heart attack Father        age 48, deceased    Social History   Social History  . Marital status: Married    Spouse name: N/A  . Number of children: 2  . Years of education: N/A   Occupational History  . special education teacher aide    Social History Main Topics  . Smoking status: Never Smoker  . Smokeless tobacco: Never Used     Comment: Never smoked  . Alcohol use No  . Drug use: No  . Sexual activity: Not Asked   Other Topics Concern  . None   Social History Narrative  . None    Review of Systems: As mentioned in HPI   Physical Exam: BP 134/90   Pulse 81   Temp 98.2 F (36.8 C) (Oral)   Ht 5\' 3"  (1.6 m)   Wt 238 lb 12.8 oz (108.3 kg)   BMI 42.30 kg/m  General:   Alert and oriented. No distress noted. Pleasant and cooperative.  Head:  Normocephalic and atraumatic. Eyes:  Conjuctiva clear without scleral icterus. Mouth:  Oral mucosa pink and moist. Good dentition. No lesions. Neck:  Supple, without mass or thyromegaly. Abdomen:  +BS, soft, non-tender and non-distended. No rebound or guarding. No HSM or masses noted. Msk:  Symmetrical without gross deformities. Normal posture. Extremities:  Without edema. Neurologic:  Alert and  oriented x4;  grossly normal neurologically. Psych:  Alert and cooperative. Normal mood and affect.

## 2017-04-08 NOTE — Patient Instructions (Addendum)
  Stick with a soft diet for now, and gradually increase to normal foods as you start feeling better.   Please have blood work done today. Depending on what that shows, you may need additional imaging now. If it is normal, we will plan on a repeat CT scan in about 6 weeks.   Call if anything worsens!   Soft-Food Meal Plan A soft-food meal plan includes foods that are safe and easy to swallow. This meal plan typically is used:  If you are having trouble chewing or swallowing foods.  As a transition meal plan after only having had liquid meals for a long period.  What do I need to know about the soft-food meal plan? A soft-food meal plan includes tender foods that are soft and easy to chew and swallow. In most cases, bite-sized pieces of food are easier to swallow. A bite-sized piece is about  inch or smaller. Foods in this plan do not need to be ground or pureed. Foods that are very hard, crunchy, or sticky should be avoided. Also, breads, cereals, yogurts, and desserts with nuts, seeds, or fruits should be avoided. What foods can I eat? Grains Rice and wild rice. Moist bread, dressing, pasta, and noodles. Well-moistened dry or cooked cereals, such as farina (cooked wheat cereal), oatmeal, or grits. Biscuits, breads, muffins, pancakes, and waffles that have been well moistened. Vegetables Shredded lettuce. Cooked, tender vegetables, including potatoes without skins. Vegetable juices. Broths or creamed soups made with vegetables that are not stringy or chewy. Strained tomatoes (without seeds). Fruits Canned or well-cooked fruits. Soft (ripe), peeled fresh fruits, such as peaches, nectarines, kiwi, cantaloupe, honeydew melon, and watermelon (without seeds). Soft berries with small seeds, such as strawberries. Fruit juices (without pulp). Meats and Other Protein Sources Moist, tender, lean beef. Mutton. Lamb. Veal. Chicken. [place]. Liver. Ham. Fish without bones. Eggs. Dairy Milk, milk  drinks, and cream. Plain cream cheese and cottage cheese. Plain yogurt. Sweets/Desserts Flavored gelatin desserts. Custard. Plain ice cream, frozen yogurt, sherbet, milk shakes, and malts. Plain cakes and cookies. Plain hard candy. Other Butter, margarine (without trans fat), and cooking oils. Mayonnaise. Cream sauces. Mild spices, salt, and sugar. Syrup, molasses, honey, and jelly. The items listed above may not be a complete list of recommended foods or beverages. Contact your dietitian for more options. What foods are not recommended? Grains Dry bread, toast, crackers that have not been moistened. Coarse or dry cereals, such as bran, granola, and shredded wheat. Tough or chewy crusty breads, such as [place] bread or baguettes. Vegetables Corn. Raw vegetables except shredded lettuce. Cooked vegetables that are tough or stringy. Tough, crisp, fried potatoes and potato skins. Fruits Fresh fruits with skins or seeds or both, such as apples, pears, or grapes. Stringy, high-pulp fruits, such as papaya, pineapple, coconut, or mango. Fruit leather, fruit roll-ups, and all dried fruits. Meats and Other Protein Sources Sausages and hot dogs. Meats with gristle. Fish with bones. Nuts, seeds, and chunky peanut or other nut butters. Sweets/Desserts Cakes or cookies that are very dry or chewy. The items listed above may not be a complete list of foods and beverages to avoid. Contact your dietitian for more information. This information is not intended to replace advice given to you by your health care provider. Make sure you discuss any questions you have with your health care provider. Document Released: 02/02/2008 Document Revised: 04/02/2016 Document Reviewed: 09/22/2013 Elsevier Interactive Patient Education  2017 [employer].

## 2017-04-09 NOTE — Assessment & Plan Note (Signed)
41 year old female with likely recent bout of gastroenteritis, with CT showing possible focal enteritis vs ileus. Does not clinically appear to have an ileus and is improving quite well now. Prominent peripancreatic lymph node on CT. Likely reactive. Will check HFP, as she states her bilirubin was high. May need additional imaging. If normal LFTs, proceed with repeat imaging in 6-8 weeks to ensure resolution/improvement in lymphadenopathy. Follow soft diet for now and advance as tolerated.

## 2017-04-12 NOTE — Progress Notes (Signed)
cc'ed to pcp °

## 2017-04-12 NOTE — Progress Notes (Signed)
CT abdomen with contrast. Thanks!

## 2017-04-12 NOTE — Progress Notes (Signed)
HFP normal. Please arrange CT abdomen in 6-8 weeks, reason: assess lymphadenopathy.

## 2017-04-12 NOTE — Progress Notes (Signed)
Outside labs from [place] with normal WBC 8.58, Hgb 12.7, BUN 17, Cr 0.71, Tbili 1.6, AST 11, ALT 28, Alk Phos 61, Lipase 85.

## 2017-04-13 ENCOUNTER — Other Ambulatory Visit: Payer: Self-pay

## 2017-04-13 DIAGNOSIS — R591 Generalized enlarged lymph nodes: Secondary | ICD-10-CM

## 2017-04-13 NOTE — Progress Notes (Signed)
Forwarding to Martina.  

## 2017-04-21 ENCOUNTER — Telehealth: Payer: Self-pay

## 2017-04-21 NOTE — Telephone Encounter (Signed)
Pt called this morning to let us know that she is still throwing up once a week and abd pain when she is nausea. She is not able to eat much. No fever. Please advise

## 2017-04-21 NOTE — Telephone Encounter (Signed)
If it's only precipitated by pork, then avoid pork. Will keep CT where it is for now.

## 2017-04-21 NOTE — Telephone Encounter (Signed)
LMOM to call back

## 2017-04-21 NOTE — Telephone Encounter (Signed)
CT has been moved up to 05/03/17 @ 5:00 pm. She is aware that she will nee to be there a 4:45 pm.She said that every time she eats pork related she has the nausea and vomiting

## 2017-04-21 NOTE — Telephone Encounter (Signed)
If it's happening once a week, is she able to pinpoint what this is associated with?   I see we have her repeat CT scheduled for July 18th. I would say to have that done in next few days instead. WOULD NEED PREGNANCY SCREEN PRIOR>

## 2017-04-22 NOTE — Telephone Encounter (Signed)
Noted  

## 2017-05-03 ENCOUNTER — Ambulatory Visit (HOSPITAL_COMMUNITY)
Admission: RE | Admit: 2017-05-03 | Discharge: 2017-05-03 | Disposition: A | Discharge status: Home / Self Care | Payer: Federal, State, Local not specified - PPO | Source: Ambulatory Visit | Attending: Gastroenterology | Admitting: Gastroenterology

## 2017-05-03 DIAGNOSIS — R911 Solitary pulmonary nodule: Secondary | ICD-10-CM | POA: Diagnosis not present

## 2017-05-03 DIAGNOSIS — N200 Calculus of kidney: Secondary | ICD-10-CM | POA: Insufficient documentation

## 2017-05-03 DIAGNOSIS — R590 Localized enlarged lymph nodes: Secondary | ICD-10-CM | POA: Insufficient documentation

## 2017-05-03 DIAGNOSIS — K760 Fatty (change of) liver, not elsewhere classified: Secondary | ICD-10-CM | POA: Diagnosis not present

## 2017-05-03 DIAGNOSIS — R591 Generalized enlarged lymph nodes: Secondary | ICD-10-CM

## 2017-05-03 MED ORDER — IOPAMIDOL (ISOVUE-300) INJECTION 61%
100.0000 mL | Freq: Once | INTRAVENOUS | Status: AC | PRN
  Administered 2017-05-03: 100 mL via INTRAVENOUS

## 2017-05-18 ENCOUNTER — Encounter: Payer: Self-pay | Admitting: Gastroenterology

## 2017-05-18 NOTE — Progress Notes (Signed)
Pt is aware of results and that we will send note to PCP. Forwarding to Manuela Schwartz to send to PCP and document that they receive it.  Please schedule OV also.

## 2017-05-18 NOTE — Progress Notes (Signed)
Good news! No enlarged lymph nodes. Fatty liver noted. She has a 2 mm pulmonary nodule. Please forward to PCP for surveillance and document they have received it. Let's get her in for an office visit. Avoid pork, as this caused N/V.

## 2017-05-24 ENCOUNTER — Ambulatory Visit: Payer: Federal, State, Local not specified - PPO | Admitting: Nurse Practitioner

## 2017-05-26 ENCOUNTER — Ambulatory Visit (HOSPITAL_COMMUNITY): Payer: Federal, State, Local not specified - PPO

## 2017-06-25 NOTE — Progress Notes (Signed)
REVIEWED- CY JUL 2018: 2 MM PULMONARY NODULE, NO ABDOMINAL LYMPHADENOPATHY.

## 2017-07-14 ENCOUNTER — Ambulatory Visit: Payer: Federal, State, Local not specified - PPO | Admitting: Gastroenterology

## 2017-07-14 ENCOUNTER — Encounter: Payer: Self-pay | Admitting: Gastroenterology

## 2017-07-14 ENCOUNTER — Ambulatory Visit (INDEPENDENT_AMBULATORY_CARE_PROVIDER_SITE_OTHER): Payer: Federal, State, Local not specified - PPO | Admitting: Gastroenterology

## 2017-07-14 VITALS — BP 148/96 | HR 74 | Temp 97.8°F | Ht 62.0 in | Wt 235.8 lb

## 2017-07-14 DIAGNOSIS — K581 Irritable bowel syndrome with constipation: Secondary | ICD-10-CM

## 2017-07-14 NOTE — Progress Notes (Signed)
Referring Provider: Yvone Neu Primary Care Physician:  Yvone Neu, MD Primary GI: Dr. Oneida Alar   Chief Complaint  Patient presents with  . Abdominal Pain    HPI:   Tamara Lynch is a 41 y.o. female presenting today with a history of likely gastroenteritis and outside CT showing possible focal enteritis vs ileus. Prominent peripancreatic lymph node on CT felt to be reactive. HFP normal. Repeat CT June 2018 without abdominal lymphadenopathy, hepatic steatosis, non-obstructing 2 mm left renal stone, right lower lobe 2 mm solid pulmonary nodule. Sent imaging to PCP for further surveillance of lung nodule.   Will still occasionally get some mild LUQ pain, now more of an "awareness". Has a BM every few days, sometimes diarrhea. No rhyme or reason. Under a lot more stress. Avoiding pork, as every time she ate this, she had vomited. Slice of ham made her sick. Saw scant blood in summer once and that was it. Nexium BID for reflux, which is helping. Cut out sodas as well. Trying to cut down on sugar.    Past Medical History:  Diagnosis Date  . Anxiety   . Depression   . GERD (gastroesophageal reflux disease)   . IBS (irritable bowel syndrome)   . 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 PONV (postoperative nausea and vomiting)   . Sleep apnea     Past Surgical History:  Procedure Laterality Date  . APPENDECTOMY    . BIOPSY N/A 08/06/2015   Procedure: BIOPSY;  Surgeon: Danie Binder, MD;  Location: [facility];  Service: Endoscopy;  Laterality: N/A;  gastric and duodenal  . CHOLECYSTECTOMY  2011  . COLONOSCOPY  2006   Dr. Algis Greenhouse: normal colonoscopy, Biopsy showed mild chronic inflammation. No evidence of microscopic colitis.  Marland Kitchen ESOPHAGEAL DILATION N/A 08/06/2015   Procedure:  ESOPHAGEAL DILATION;  Surgeon: Danie Binder, MD;  Location: [facility];  Service: Endoscopy;  Laterality: N/A;  [place] 15/16  . ESOPHAGOGASTRODUODENOSCOPY   2011   Dr. Algis Greenhouse. Propofol. hiatal hernia. No Barrett's.  . ESOPHAGOGASTRODUODENOSCOPY  2010   Dr. Algis Greenhouse: propofol.hiatal hernia and erosions. VO:[id number] chronic gastritis/esophagitis  . ESOPHAGOGASTRODUODENOSCOPY  2006   Dr. Docia Furl: normal upper endoscopy. Biopsies of the duodenum negative for celiac disease.TTG also normal  . ESOPHAGOGASTRODUODENOSCOPY (EGD) WITH PROPOFOL N/A 08/06/2015   SLF: Dysphagia due to possible proximal esophageal web and/or reflux 2. Few gastric polyps 3. Dyspepsia due to mild non-erosive gastriitis and GERD   . NASAL SEPTUM SURGERY  2002  . POLYPECTOMY N/A 08/06/2015   Procedure: POLYPECTOMY;  Surgeon: Danie Binder, MD;  Location: [facility];  Service: Endoscopy;  Laterality: N/A;  gastric  . TONSILLECTOMY  2012    Current Outpatient Prescriptions  Medication Sig Dispense Refill  . cabergoline (DOSTINEX) 0.5 MG tablet Take 0.25 mg by mouth 2 (two) times a week.    . diazepam (VALIUM) 5 MG tablet Take 5 mg by mouth every 6 (six) hours as needed for anxiety.    Marland Kitchen esomeprazole (NEXIUM) 40 MG capsule Take 1 capsule (40 mg total) by mouth 2 (two) times daily before a meal. 180 capsule 3  . ferrous sulfate 325 (65 FE) MG tablet Take 325 mg by mouth daily with breakfast.    . losartan (COZAAR) 100 MG tablet Take 100 mg by mouth daily.  6  . metoprolol succinate (TOPROL-XL) 50 MG 24 hr tablet Take 50 mg by mouth daily. Take with or immediately following  a meal.    . PRISTIQ 50 MG 24 hr tablet      No current facility-administered medications for this visit.     Allergies as of 07/14/2017 - Review Complete 07/14/2017  Allergen Reaction Noted  . Biaxin [clarithromycin]  06/19/2015  . Lidocaine viscous  08/12/2015  . Tequin [gatifloxacin]  06/19/2015  . Viberzi [eluxadoline]  08/02/2015    Family History  Problem Relation Age of Onset  . Barrett's esophagus Sister   . Colon cancer Neg Hx   . Colon polyps Neg Hx   . Cervical cancer Mother   . Breast cancer  Mother   . Heart attack Father        age 40, deceased    Social History   Social History  . Marital status: Married    Spouse name: N/A  . Number of children: 2  . Years of education: N/A   Occupational History  . special education teacher aide    Social History Main Topics  . Smoking status: Never Smoker  . Smokeless tobacco: Never Used     Comment: Never smoked  . Alcohol use No  . Drug use: No  . Sexual activity: Not Asked   Other Topics Concern  . None   Social History Narrative  . None    Review of Systems: As mentioned in HPI   Physical Exam: BP (!) 148/96   Pulse 74   Temp 97.8 F (36.6 C) (Oral)   Ht 5\' 2"  (1.575 m)   Wt 235 lb 12.8 oz (107 kg)   BMI 43.13 kg/m  General:   Alert and oriented. No distress noted. Pleasant and cooperative.  Head:  Normocephalic and atraumatic. Eyes:  Conjuctiva clear without scleral icterus. Mouth:  Oral mucosa pink and moist. Good dentition. No lesions. Abdomen:  +BS, soft, non-tender and non-distended. No rebound or guarding. No HSM or masses noted. Msk:  Symmetrical without gross deformities. Normal posture. Extremities:  Without edema. Neurologic:  Alert and  oriented x4 Psych:  Alert and cooperative. Normal mood and affect.

## 2017-07-14 NOTE — Patient Instructions (Signed)
Continue to avoid pork as you are doing.  Call if any issues, otherwise we will see you in 6 months!

## 2017-07-14 NOTE — Assessment & Plan Note (Signed)
Seen earlier this summer with likely resolving gastroenteritis. Repeat CT due to concern for lymphadenopathy normal without any evidence of lymphadenopathy. PCP to follow-up on small incidentally noted lung nodule. Clinically improved. Abstaining from pork due to resulting N/V. Increase fiber in diet. Declining any prescriptive agents. Use Miralax prn. Return in 6 months.

## 2017-07-15 NOTE — Progress Notes (Signed)
CC'ED TO PCP 

## 2017-11-08 ENCOUNTER — Other Ambulatory Visit: Payer: Self-pay | Admitting: *Deleted

## 2017-11-10 MED ORDER — ESOMEPRAZOLE MAGNESIUM 40 MG PO CPDR: 40.0000 mg | DELAYED_RELEASE_CAPSULE | Freq: Two times a day (BID) | ORAL | 2 refills | Status: DC

## 2017-12-22 NOTE — Progress Notes (Signed)
REVIEWED-NO ADDITIONAL RECOMMENDATIONS. 

## 2018-01-11 ENCOUNTER — Ambulatory Visit: Payer: Federal, State, Local not specified - PPO | Admitting: Gastroenterology

## 2018-01-11 ENCOUNTER — Encounter: Payer: Self-pay | Admitting: Gastroenterology

## 2018-01-11 VITALS — BP 126/76 | HR 83 | Temp 97.7°F | Ht 62.0 in | Wt 241.8 lb

## 2018-01-11 DIAGNOSIS — K588 Other irritable bowel syndrome: Secondary | ICD-10-CM | POA: Diagnosis not present

## 2018-01-11 DIAGNOSIS — K219 Gastro-esophageal reflux disease without esophagitis: Secondary | ICD-10-CM

## 2018-01-11 MED ORDER — ESOMEPRAZOLE MAGNESIUM 40 MG PO CPDR: 40.0000 mg | DELAYED_RELEASE_CAPSULE | Freq: Two times a day (BID) | ORAL | 2 refills | Status: DC

## 2018-01-11 NOTE — Patient Instructions (Signed)
I am glad you are doing well!  Continue Nexium twice a day.   Please call if you need anything.  We will see you in 1 year!  It was a pleasure to see you today. I strive to create trusting relationships with patients to provide genuine, compassionate, and quality care. I value your feedback. If you receive a survey regarding your visit,  I greatly appreciate you taking time to fill this out.   Annitta Needs, PhD, ANP-BC [facility]

## 2018-01-11 NOTE — Assessment & Plan Note (Signed)
Nexium BID. Previously failed Prilosec, Protonix, aciphex, Dexilant. Unable to decrease to once daily dosing. Continue dietary/behavior modifications. Refills provided. Return in 1 year.

## 2018-01-11 NOTE — Progress Notes (Signed)
Referring Provider: Yvone Neu Primary Care Physician:  Yvone Neu, MD Primary GI: Dr. Oneida Alar   Chief Complaint  Patient presents with  . Irritable Bowel Syndrome    f/u. Doing fine    HPI:   Tamara Lynch is a 42 y.o. female presenting today with a history of likely gastroenteritis and outside CT showing possible focal enteritis vs ileus. Prominent peripancreatic lymph node on CT felt to be reactive. HFP normal. Repeat CT June 2018 without abdominal lymphadenopathy, hepatic steatosis, non-obstructing 2 mm left renal stone, right lower lobe 2 mm solid pulmonary nodule. Sent imaging to PCP for further surveillance of lung nodule. Last seen in Sept 2018 and was doing well.  Diarrhea resolved. Minimal straining. Only strains if doesn't have enough water. Nexium BID. Tried to decrease but couldn't. No dysphagia. No rectal bleeding.     Past Medical History:  Diagnosis Date  . Anxiety   . Depression   . GERD (gastroesophageal reflux disease)   . IBS (irritable bowel syndrome)   . 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 PONV (postoperative nausea and vomiting)   . Sleep apnea     Past Surgical History:  Procedure Laterality Date  . APPENDECTOMY    . BIOPSY N/A 08/06/2015   Procedure: BIOPSY;  Surgeon: Danie Binder, MD;  Location: [facility];  Service: Endoscopy;  Laterality: N/A;  gastric and duodenal  . CHOLECYSTECTOMY  2011  . COLONOSCOPY  2006   Dr. Algis Greenhouse: normal colonoscopy, Biopsy showed mild chronic inflammation. No evidence of microscopic colitis.  Marland Kitchen ESOPHAGEAL DILATION N/A 08/06/2015   Procedure:  ESOPHAGEAL DILATION;  Surgeon: Danie Binder, MD;  Location: [facility];  Service: Endoscopy;  Laterality: N/A;  [place] 15/16  . ESOPHAGOGASTRODUODENOSCOPY  2011   Dr. Algis Greenhouse. Propofol. hiatal hernia. No Barrett's.  . ESOPHAGOGASTRODUODENOSCOPY  2010   Dr. Algis Greenhouse: propofol.hiatal hernia and erosions. VV:OHYW  chronic gastritis/esophagitis  . ESOPHAGOGASTRODUODENOSCOPY  2006   Dr. Docia Furl: normal upper endoscopy. Biopsies of the duodenum negative for celiac disease.TTG also normal  . ESOPHAGOGASTRODUODENOSCOPY (EGD) WITH PROPOFOL N/A 08/06/2015   SLF: Dysphagia due to possible proximal esophageal web and/or reflux 2. Few gastric polyps 3. Dyspepsia due to mild non-erosive gastriitis and GERD   . NASAL SEPTUM SURGERY  2002  . POLYPECTOMY N/A 08/06/2015   Procedure: POLYPECTOMY;  Surgeon: Danie Binder, MD;  Location: [facility];  Service: Endoscopy;  Laterality: N/A;  gastric  . TONSILLECTOMY  2012    Current Outpatient Medications  Medication Sig Dispense Refill  . carvedilol (COREG) 25 MG tablet Take 25 mg by mouth 2 (two) times daily with a meal.    . esomeprazole (NEXIUM) 40 MG capsule Take 1 capsule (40 mg total) by mouth 2 (two) times daily before a meal. 180 capsule 2  . losartan (COZAAR) 100 MG tablet Take 100 mg by mouth daily.  6  . PRISTIQ 50 MG 24 hr tablet Take 50 mg by mouth daily.     . cabergoline (DOSTINEX) 0.5 MG tablet Take 0.25 mg by mouth 2 (two) times a week.    . diazepam (VALIUM) 5 MG tablet Take 5 mg by mouth every 6 (six) hours as needed for anxiety.    . ferrous sulfate 325 (65 FE) MG tablet Take 325 mg by mouth daily with breakfast.    . metoprolol succinate (TOPROL-XL) 50 MG 24 hr tablet Take 50 mg by mouth daily. Take with or  immediately following a meal.     No current facility-administered medications for this visit.     Allergies as of 01/11/2018 - Review Complete 01/11/2018  Allergen Reaction Noted  . Biaxin [clarithromycin]  06/19/2015  . Lidocaine viscous  08/12/2015  . Tequin [gatifloxacin]  06/19/2015  . Viberzi [eluxadoline]  08/02/2015    Family History  Problem Relation Age of Onset  . Barrett's esophagus Sister   . Colon cancer Neg Hx   . Colon polyps Neg Hx   . Cervical cancer Mother   . Breast cancer Mother   . Heart attack Father        age  36, deceased    Social History   Socioeconomic History  . Marital status: Married    Spouse name: None  . Number of children: 2  . Years of education: None  . Highest education level: None  Social Needs  . Financial resource strain: None  . Food insecurity - worry: None  . Food insecurity - inability: None  . Transportation needs - medical: None  . Transportation needs - non-medical: None  Occupational History  . Occupation: Teacher, adult education  Tobacco Use  . Smoking status: Never Smoker  . Smokeless tobacco: Never Used  . Tobacco comment: Never smoked  Substance and Sexual Activity  . Alcohol use: No    Alcohol/week: 0.0 oz  . Drug use: No  . Sexual activity: None  Other Topics Concern  . None  Social History Narrative  . None    Review of Systems: Gen: Denies fever, chills, anorexia. Denies fatigue, weakness, weight loss.  CV: Denies chest pain, palpitations, syncope, peripheral edema, and claudication. Resp: Denies dyspnea at rest, cough, wheezing, coughing up blood, and pleurisy. GI: see HPI  Derm: Denies rash, itching, dry skin Psych: Denies depression, anxiety, memory loss, confusion. No homicidal or suicidal ideation.  Heme: Denies bruising, bleeding, and enlarged lymph nodes.  Physical Exam: BP 126/76   Pulse 83   Temp 97.7 F (36.5 C) (Oral)   Ht 5\' 2"  (1.575 m)   Wt 241 lb 12.8 oz (109.7 kg)   BMI 44.23 kg/m  General:   Alert and oriented. No distress noted. Pleasant and cooperative.  Head:  Normocephalic and atraumatic. Eyes:  Conjuctiva clear without scleral icterus. Mouth:  Oral mucosa pink and moist.  Abdomen:  +BS, soft, non-tender and non-distended. No rebound or guarding. No HSM or masses noted. Msk:  Symmetrical without gross deformities. Normal posture. Extremities:  Without edema. Neurologic:  Alert and  oriented x4 Psych:  Alert and cooperative. Normal mood and affect.

## 2018-01-11 NOTE — Progress Notes (Signed)
cc'ed to pcp °

## 2018-01-11 NOTE — Assessment & Plan Note (Signed)
Historically more with diarrhea, now resolved. Now with occasional constipation. Managed with dietary measures. Return in 1 year.

## 2018-08-10 ENCOUNTER — Telehealth: Payer: Self-pay

## 2018-08-10 NOTE — Telephone Encounter (Signed)
I have faxed the request to [facility] for the update on the PA for Nexium.

## 2018-08-24 NOTE — Telephone Encounter (Signed)
PA was approved for nexium and is valid from 07/11/2018 through 08/10/2019.  Faxing to pharmacy and sending to scan.

## 2018-10-19 ENCOUNTER — Encounter: Payer: Self-pay | Admitting: Gastroenterology

## 2018-10-19 ENCOUNTER — Ambulatory Visit (INDEPENDENT_AMBULATORY_CARE_PROVIDER_SITE_OTHER): Payer: Federal, State, Local not specified - PPO | Admitting: Gastroenterology

## 2018-10-19 DIAGNOSIS — R1010 Upper abdominal pain, unspecified: Secondary | ICD-10-CM

## 2018-10-19 MED ORDER — SUCRALFATE 1 GM/10ML PO SUSP: 1.0000 g | Freq: Four times a day (QID) | ORAL | 0 refills | Status: DC

## 2018-10-19 MED ORDER — HYDROCODONE-ACETAMINOPHEN 5-325 MG PO TABS: ORAL_TABLET | ORAL | 0 refills | Status: DC

## 2018-10-19 NOTE — Assessment & Plan Note (Signed)
LIKELY A FLARE OF IBS OR GASTRITIS. DIFFERENTIAL DIAGNOSIS INCLUDES: GASTRITIS, UNCONTROLLED GERD, LESS LIKELY CELIAC SPRUE, PUD, OR OCCULT MALIGNANCY/CBD STONE.  DRINK WATER TO KEEP YOUR URINE LIGHT YELLOW.  FOLLOW A FULL LIQUID DIET AND DRINK BOOST, ENSURE, OR CARNATION INSTANT BREAKFAST with ALMOND OR SOY MILK FOUR TIMES A DAY UNTIL YOU CAN ADVANCE TO SOLID FOOD. USE TYLENOL XS EVERY 8 HOURS FOR 4 DAYS. USE NORCO 1/2 TAB WHEN NEEDED TO RELIEVE ABDOMINAL PAIN. NO MORE THAN 3 WHOLE TABLETS A DAY WHILE TAKING TYLENOL XS. USE CARAFATE TO RELIEVE UPPER ABDOMINAL PAIN. COMPLETE LABS TODAY. I WILL OBTAIN LABS FROM DR. HUNGARLAND. PLEASE CALL IN 2 WEEKS IF YOUR SYMPTOMS ARE NOT IMPROVED.   FOLLOW UP IN 2 MOS.

## 2018-10-19 NOTE — Patient Instructions (Signed)
DRINK WATER TO KEEP YOUR URINE LIGHT YELLOW.  FOLLOW A FULL LIQUID DIET AND DRINK BOOST, ENSURE, OR CARNATION INSTANT BREAKFAST with ALMOND OR SOY MILK FOUR TIMES A DAY UNTIL YOU CAN ADVANCE TO SOLID FOOD.   USE TYLENOL XS EVERY 8 HOURS FOR 4 DAYS. USE NORCO 1/2 TAB WHEN NEEDED TO RELIEVE ABDOMINAL PAIN. NO MORE THAN 3 WHOLE TABLETS A DAY WHILE TAKING TYLENOL XS.  USE CARAFATE TO RELIEVE UPPER ABDOMINAL PAIN.   COMPLETE LABS TODAY.  I WILL OBTAIN LABS FROM DR. HUNGARLAND.   PLEASE CALL IN 2 WEEKS IF YOUR SYMPTOMS ARE NOT IMPROVED.   FOLLOW UP IN 2 MOS.    Full Liquid Diet A high-calorie, high-protein supplement should be used to meet your nutritional requirements when the full liquid diet is continued for more than 2 or 3 days. If this diet is to be used for an extended period of time (more than 7 days), a multivitamin should be considered.  Breads and Starches  Allowed: None are allowed except crackers WHOLE OR pureed (made into a thick, smooth soup) in soup.   Avoid: Any others.    Potatoes/Pasta/Rice  Allowed: ANY ITEM AS A SOUP OR SMALL PLATE OF MASHED POTATOES.       Vegetables  Allowed: Strained tomato or vegetable juice. Vegetables pureed in soup.   Avoid: Any others.    Fruit  Allowed: Any strained fruit juices and fruit drinks. Include 1 serving of citrus or vitamin C-enriched fruit juice daily.   Avoid: Any others.  Meat and Meat Substitutes  Allowed: Egg  Avoid: Any meat, fish, or fowl. All cheese.  Milk  Allowed: Milk beverages, including milk shakes and instant breakfast mixes. Smooth yogurt.   Avoid: Any others. Avoid dairy products if not tolerated.    Soups and Combination Foods  Allowed: Broth, strained cream soups. Strained, broth-based soups.   Avoid: Any others.    Desserts and Sweets  Allowed: flavored gelatin,plain ice cream, sherbet, smooth pudding, junket, fruit ices, frozen ice pops, pudding pops,, frozen fudge pops,  chocolate syrup. Sugar, honey, jelly, syrup.   Avoid: Any others.  Fats and Oils  Allowed: Margarine, butter, cream, sour cream, oils.   Avoid: Any others.  Beverages  Allowed: All.   Avoid: None.  Condiments  Allowed: Iodized salt, pepper, spices, flavorings. Cocoa powder.   Avoid: Any others.    SAMPLE MEAL PLAN Breakfast   cup orange juice.   1 OR 2 EGGS   1 cup  milk.   1 cup beverage (coffee or tea).   Cream or sugar, if desired.    Midmorning Snack  2 SCRAMBLED OR HARD BOILED EGG   Lunch  1 cup cream soup.    cup fruit juice.   1 cup milk.    cup custard.   1 cup beverage (coffee or tea).   Cream or sugar, if desired.    Midafternoon Snack  1 cup milk shake.  Dinner  1 cup cream soup.    cup fruit juice.   1 cup milk.    cup pudding.   1 cup beverage (coffee or tea).   Cream or sugar, if desired.  Evening Snack  1 cup supplement.  To increase calories, add sugar, cream, butter, or margarine if possible. Nutritional supplements will also increase the total calories.

## 2018-10-19 NOTE — Progress Notes (Signed)
Subjective:    Patient ID: Tamara Lynch, female    DOB: [birth year], 42 y.o.   MRN: [id number]  Yvone Neu, MD   HPI Pain and nausea in upper abdomen for past 2 weeks. NO triggers. SHARP/STABBING AND ACHY/DULL ALL/BURNING. NOTHING MAKE SIT BETTER: NEXIUM BID, TUMS, ALKA SELZTER CHEWABLES, ZOFRAN, BRAT DIET. THE TIME. MOVES ACROSS THE TOP OF HER ABDOMEN. SUBJECTIVE CHILLS 2 WEEKS. PT FEELS SYMPTOMS WORSE OVER PAST 2 WEEKS: PAIN MORE INTENSE. IBUPROFEN: PRN-ONE LAST WEEK AND ONE THIS WEEK. NO ASPIRIN, BC/GOODY POWDERS,  OR NAPROXEN/ALEVE. HEARTBURN: BURNING  UP INTO THROAT. BMs: 4-6/DAY(USUAL). HAS BILE SALT DIARRHEA. WAS UP TO 255 LBS AND CURRENTLY DECREASED APPETITE. EATING: GETS WAY WORSE AND HAS TO GO TO BATHROOM AND NAUSEA COMES AS WELL. CAN'T TAKE LIDOCAINE(BLISTERS IN MOUTH). CARAFATE HAS HELPED IN THE PAST.  SAW PCP DREW LABS AND FEW WBCs IN URINE AND TOOK MACROBID FOR 3 DAYS AND MADE SYMPTOMS WORSE. STRESS: SAME(MEDIUM TO HIGH) DUE TO TEACHING KINDERGARTEN. VOMITED x2(1ST TIME 2 WEEKS AGO AND THEN SUN). ETOH: NONE. PAIN: 6-7/10 DURING UNLESS SHE EATS THEN 8-9/10. AWAKENED EVERY NIGHT FOR PAST 2 WEEKS(8-9/10). GB REMOVED 7 YRS AGO.    PT DENIES FEVER, DYSURIA, HEMATURIA, HEMATOCHEZIA, HEMATEMESIS, melena, CHEST PAIN, SHORTNESS OF BREATH,  CHANGE IN BOWEL IN HABITS, constipation, abdominal pain, problems swallowing, problems with sedation, OR heartburn or indigestion.  Past Medical History:  Diagnosis Date  . Anxiety   . Depression   . GERD (gastroesophageal reflux disease)   . IBS (irritable bowel syndrome)   . 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 PONV (postoperative nausea and vomiting)   . Sleep apnea     Past Surgical History:  Procedure Laterality Date  . APPENDECTOMY    . BIOPSY N/A 08/06/2015   Procedure: BIOPSY;  Surgeon: Danie Binder, MD;  Location: [facility];  Service: Endoscopy;  Laterality: N/A;  gastric and duodenal   . CHOLECYSTECTOMY  2011  . COLONOSCOPY  2006   Dr. Algis Greenhouse: normal colonoscopy, Biopsy showed mild chronic inflammation. No evidence of microscopic colitis.  Marland Kitchen ESOPHAGEAL DILATION N/A 08/06/2015   Procedure:  ESOPHAGEAL DILATION;  Surgeon: Danie Binder, MD;  Location: [facility];  Service: Endoscopy;  Laterality: N/A;  [facility] 15/16  . ESOPHAGOGASTRODUODENOSCOPY  2011   Dr. Algis Greenhouse. Propofol. hiatal hernia. No Barrett's.  . ESOPHAGOGASTRODUODENOSCOPY  2010   Dr. Algis Greenhouse: propofol.hiatal hernia and erosions. CB:JSEG chronic gastritis/esophagitis  . ESOPHAGOGASTRODUODENOSCOPY  2006   Dr. Docia Furl: normal upper endoscopy. Biopsies of the duodenum negative for celiac disease.TTG also normal  . ESOPHAGOGASTRODUODENOSCOPY (EGD) WITH PROPOFOL N/A 08/06/2015   SLF: Dysphagia due to possible proximal esophageal web and/or reflux 2. Few gastric polyps 3. Dyspepsia due to mild non-erosive gastriitis and GERD   . NASAL SEPTUM SURGERY  2002  . POLYPECTOMY N/A 08/06/2015   Procedure: POLYPECTOMY;  Surgeon: Danie Binder, MD;  Location: [facility];  Service: Endoscopy;  Laterality: N/A;  gastric  . TONSILLECTOMY  2012    Allergies  Allergen Reactions  . Biaxin [Clarithromycin]   . Lidocaine Viscous Hcl     THROAT PAIN/BLISTERS IN MOUTH  . Tequin [Gatifloxacin]   . Viberzi [Eluxadoline]     Tingling from head to toe     Current Outpatient Medications  Medication Sig Dispense Refill  . cabergoline (DOSTINEX) 0.5 MG tablet Take 0.25 mg by mouth 2 (two) times a week.    Marland Kitchen CALCIUM PO Take by  mouth daily.    . carvedilol (COREG) 25 MG tablet Take 25 mg by mouth 2 (two) times daily with a meal.    . Cholecalciferol (VITAMIN D3) 1.25 MG (50000 UT) CAPS Take 1 capsule by mouth once a week.  2  . esomeprazole (NEXIUM) 40 MG capsule Take 1 capsule (40 mg total) by mouth 2 (two) times daily before a meal. 180 capsule 2  . losartan (COZAAR) 100 MG tablet Take 100 mg by mouth daily.  6  . PRISTIQ 50 MG 24 hr  tablet Take 50 mg by mouth daily.      Review of Systems PER HPI OTHERWISE ALL SYSTEMS ARE NEGATIVE.    Objective:   Physical Exam  Constitutional: She is oriented to person, place, and time. She appears well-developed and well-nourished. No distress.  HENT:  Head: Normocephalic and atraumatic.  Mouth/Throat: Oropharynx is clear and moist. No oropharyngeal exudate.  Eyes: Pupils are equal, round, and reactive to light. No scleral icterus.  Neck: Normal range of motion. Neck supple.  Cardiovascular: Normal rate, regular rhythm and normal heart sounds.  Pulmonary/Chest: Effort normal and breath sounds normal. No respiratory distress.  Abdominal: Soft. Bowel sounds are normal. She exhibits no distension. There is tenderness. There is no guarding. No hernia.  MODERATE TTP IN EPIGASTRIUM, MILD IN LLQ/LUQ  Musculoskeletal: She exhibits no edema.  Lymphadenopathy:    She has no cervical adenopathy.  Neurological: She is alert and oriented to person, place, and time.  NO FOCAL DEFICITS  Psychiatric:  FLAT AFFECT, SLIGHTLY ANXIOUS MOOD  Vitals reviewed.     Assessment & Plan:

## 2018-10-20 LAB — HEPATIC FUNCTION PANEL
AG Ratio: 1.7 (calc) (ref 1.0–2.5)
ALBUMIN MSPROF: 4.5 g/dL (ref 3.6–5.1)
ALKALINE PHOSPHATASE (APISO): 48 U/L (ref 33–115)
ALT: 18 U/L (ref 6–29)
AST: 9 U/L — AB (ref 10–30)
BILIRUBIN INDIRECT: 0.8 mg/dL (ref 0.2–1.2)
Bilirubin, Direct: 0.2 mg/dL (ref 0.0–0.2)
Globulin: 2.6 g/dL (calc) (ref 1.9–3.7)
Total Bilirubin: 1 mg/dL (ref 0.2–1.2)
Total Protein: 7.1 g/dL (ref 6.1–8.1)

## 2018-10-20 LAB — TISSUE TRANSGLUTAMINASE, IGA: (TTG) AB, IGA: 1 U/mL

## 2018-10-20 LAB — LIPASE: Lipase: 10 U/L (ref 7–60)

## 2018-10-20 NOTE — Progress Notes (Signed)
SCHEDULED

## 2018-10-20 NOTE — Progress Notes (Signed)
CC'D TO PCP °

## 2018-10-21 NOTE — Progress Notes (Signed)
CC'D TO PCP °

## 2018-12-06 ENCOUNTER — Encounter: Payer: Self-pay | Admitting: Gastroenterology

## 2019-01-04 ENCOUNTER — Telehealth: Payer: Self-pay

## 2019-01-04 ENCOUNTER — Other Ambulatory Visit: Payer: Self-pay | Admitting: Gastroenterology

## 2019-01-04 ENCOUNTER — Ambulatory Visit: Payer: Federal, State, Local not specified - PPO | Admitting: Gastroenterology

## 2019-01-04 ENCOUNTER — Encounter: Payer: Self-pay | Admitting: Gastroenterology

## 2019-01-04 ENCOUNTER — Telehealth: Payer: Self-pay | Admitting: *Deleted

## 2019-01-04 ENCOUNTER — Other Ambulatory Visit (HOSPITAL_COMMUNITY)
Admission: RE | Admit: 2019-01-04 | Discharge: 2019-01-04 | Disposition: A | Discharge status: Home / Self Care | Payer: Federal, State, Local not specified - PPO | Source: Ambulatory Visit | Attending: Gastroenterology | Admitting: Gastroenterology

## 2019-01-04 ENCOUNTER — Ambulatory Visit (HOSPITAL_COMMUNITY)
Admission: RE | Admit: 2019-01-04 | Discharge: 2019-01-04 | Disposition: A | Discharge status: Home / Self Care | Payer: Federal, State, Local not specified - PPO | Source: Ambulatory Visit | Attending: Gastroenterology | Admitting: Gastroenterology

## 2019-01-04 DIAGNOSIS — R1012 Left upper quadrant pain: Secondary | ICD-10-CM | POA: Insufficient documentation

## 2019-01-04 LAB — URINALYSIS, COMPLETE (UACMP) WITH MICROSCOPIC
BACTERIA UA: NONE SEEN
Bilirubin Urine: NEGATIVE
Glucose, UA: NEGATIVE mg/dL
Ketones, ur: NEGATIVE mg/dL
Leukocytes,Ua: NEGATIVE
Nitrite: NEGATIVE
Protein, ur: NEGATIVE mg/dL
Specific Gravity, Urine: 1.013 (ref 1.005–1.030)
pH: 6 (ref 5.0–8.0)

## 2019-01-04 LAB — COMPREHENSIVE METABOLIC PANEL
ALT: 22 U/L (ref 0–44)
AST: 14 U/L — ABNORMAL LOW (ref 15–41)
Albumin: 4.4 g/dL (ref 3.5–5.0)
Alkaline Phosphatase: 49 U/L (ref 38–126)
Anion gap: 11 (ref 5–15)
BUN: 14 mg/dL (ref 6–20)
CO2: 22 mmol/L (ref 22–32)
Calcium: 9.3 mg/dL (ref 8.9–10.3)
Chloride: 103 mmol/L (ref 98–111)
Creatinine, Ser: 0.61 mg/dL (ref 0.44–1.00)
GFR calc Af Amer: >60 mL/min (ref >60)
Glucose, Bld: 92 mg/dL (ref 70–99)
Potassium: 4.1 mmol/L (ref 3.5–5.1)
Sodium: 136 mmol/L (ref 135–145)
Total Bilirubin: 1.4 mg/dL — ABNORMAL HIGH (ref 0.3–1.2)
Total Protein: 7.5 g/dL (ref 6.5–8.1)

## 2019-01-04 LAB — CBC WITH DIFFERENTIAL/PLATELET
Abs Immature Granulocytes: 0.05 10*3/uL (ref 0.00–0.07)
Basophils Absolute: 0 10*3/uL (ref 0.0–0.1)
Basophils Relative: 0 %
Eosinophils Absolute: 0 10*3/uL (ref 0.0–0.5)
Eosinophils Relative: 0 %
HEMATOCRIT: 40.3 % (ref 36.0–46.0)
Hemoglobin: 12.9 g/dL (ref 12.0–15.0)
Immature Granulocytes: 1 %
Lymphocytes Relative: 15 %
Lymphs Abs: 1.4 10*3/uL (ref 0.7–4.0)
MCH: 29 pg (ref 26.0–34.0)
MCHC: 32 g/dL (ref 30.0–36.0)
MCV: 90.6 fL (ref 80.0–100.0)
Monocytes Absolute: 0.3 10*3/uL (ref 0.1–1.0)
Monocytes Relative: 4 %
NEUTROS PCT: 80 %
Neutro Abs: 7.6 10*3/uL (ref 1.7–7.7)
Platelets: 249 10*3/uL (ref 150–400)
RBC: 4.45 MIL/uL (ref 3.87–5.11)
RDW: 12.4 % (ref 11.5–15.5)
WBC: 9.4 10*3/uL (ref 4.0–10.5)
nRBC: 0 % (ref 0.0–0.2)

## 2019-01-04 LAB — LIPASE, BLOOD: Lipase: 22 U/L (ref 11–51)

## 2019-01-04 NOTE — Telephone Encounter (Signed)
REVIEWED-NO ADDITIONAL RECOMMENDATIONS. 

## 2019-01-04 NOTE — Progress Notes (Signed)
CC'D TO PCP °

## 2019-01-04 NOTE — Progress Notes (Signed)
PT is aware.

## 2019-01-04 NOTE — Progress Notes (Signed)
Subjective:    Patient ID: Tamara Lynch, female    DOB: 10/08/1976, 43 y.o.   MRN: [id number]  HPI Having abdominal pain(SHARP: UNDER RIB TO PELVIS, NO RADIATION)/diarrhea( 4 BEFORE LEFT SCHOOL AND 2 WHEN GOT HOME). NOT USUAL IBS PAIN AND USUALLY GOING TO BATHROOM MAKES PAIN BETTER. TAKING NEW SHOT FOR WEIGHT LOSS. NAUSEA BUT NOW VOMITING. IBS. WHOLE LEFT SIDE IS KILLING HER. NO ABX OR TRAVEL. HAD CHICKEN NODDLE FORM CHIK FIL A. DRINKING ONLY WATER. DAIRY: LACTOSE FREE MILK, SHREDDED WHEAT-NO BLOATING. LMP: FEB 17. NO MVA OR INJURY TO LEFT SIDE. FIRST TIME:KIDNEY STONES THIS AM. NO ASPIRIN, BC/GOODY POWDERS, IBUPROFEN/MOTRIN, OR NAPROXEN/ALEVE. TYLENOL ONLY. NO SICK CONTACTS.   PT DENIES FEVER, CHILLS, HEMATURIA OR DYSURIA,  HEMATOCHEZIA, HEMATEMESIS,  melena, CHEST PAIN, SHORTNESS OF BREATH,  CHANGE IN BOWEL IN HABITS, constipation, problems swallowing, problems with sedation, heartburn or indigestion.  Past Medical History:  Diagnosis Date  . Anxiety   . Depression   . GERD (gastroesophageal reflux disease)   . IBS (irritable bowel syndrome)   . 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 PONV (postoperative nausea and vomiting)   . Sleep apnea     Past Surgical History:  Procedure Laterality Date  . APPENDECTOMY    . BIOPSY N/A 08/06/2015   Procedure: BIOPSY;  Surgeon: Danie Binder, MD;  Location: [facility];  Service: Endoscopy;  Laterality: N/A;  gastric and duodenal  . CHOLECYSTECTOMY  2011  . COLONOSCOPY  2006   Dr. Algis Greenhouse: normal colonoscopy, Biopsy showed mild chronic inflammation. No evidence of microscopic colitis.  Marland Kitchen ESOPHAGEAL DILATION N/A 08/06/2015   Procedure:  ESOPHAGEAL DILATION;  Surgeon: Danie Binder, MD;  Location: [facility];  Service: Endoscopy;  Laterality: N/A;  [facility] 15/16  . ESOPHAGOGASTRODUODENOSCOPY  2011   Dr. Algis Greenhouse. Propofol. hiatal hernia. No Barrett's.  . ESOPHAGOGASTRODUODENOSCOPY  2010   Dr. Algis Greenhouse:  propofol.hiatal hernia and erosions. GX:QJJH chronic gastritis/esophagitis  . ESOPHAGOGASTRODUODENOSCOPY  2006   Dr. Docia Furl: normal upper endoscopy. Biopsies of the duodenum negative for celiac disease.TTG also normal  . ESOPHAGOGASTRODUODENOSCOPY (EGD) WITH PROPOFOL N/A 08/06/2015   SLF: Dysphagia due to possible proximal esophageal web and/or reflux 2. Few gastric polyps 3. Dyspepsia due to mild non-erosive gastriitis and GERD   . NASAL SEPTUM SURGERY  2002  . POLYPECTOMY N/A 08/06/2015   Procedure: POLYPECTOMY;  Surgeon: Danie Binder, MD;  Location: [facility];  Service: Endoscopy;  Laterality: N/A;  gastric  . TONSILLECTOMY  2012   Allergies  Allergen Reactions  . Biaxin [Clarithromycin]   . Lidocaine Viscous Hcl     THROAT PAIN/BLISTERS IN MOUTH  . Tequin [Gatifloxacin]   . Viberzi [Eluxadoline]     Tingling from head to toe   Current Outpatient Medications  Medication Sig    . cabergoline (DOSTINEX) 0.5 MG tablet Take 0.25 mg by mouth 2 (two) times a week.    Marland Kitchen CALCIUM PO Take by mouth daily.    . carvedilol (COREG) 25 MG tablet Take 25 mg by mouth 2 (two) times daily with a meal.    . Cholecalciferol (VITAMIN D3) 1.25 MG (50000 UT) CAPS Take 1 capsule by mouth once a week.    . esomeprazole (NEXIUM) 40 MG capsule Take 1 capsule (40 mg total) by mouth 2 (two) times daily before a meal.    . Liraglutide -Weight Management (SAXENDA) 18 MG/3ML SOPN Inject 0.6 mLs into the skin daily. SINCE LAST WED   .  losartan (COZAAR) 100 MG tablet Take 100 mg by mouth daily.    Marland Kitchen PRISTIQ 50 MG 24 hr tablet Take 50 mg by mouth daily.     . traZODone (DESYREL) 50 MG tablet Take 1 tablet by mouth at bedtime.    .      .       Review of Systems PER HPI OTHERWISE ALL SYSTEMS ARE NEGATIVE.    Objective:   Physical Exam        Assessment & Plan:

## 2019-01-04 NOTE — Assessment & Plan Note (Signed)
ACUTE ONSET AND DIFFERENTIAL DIAGNOSIS INCLUDES: KIDNEY STONES, UTI, IBS FLARE, VIRAL ILLNESS LESS LIKELY PANCREATITIS OR INFECTIOUS COLITIS.  COMPLETE STAT LABS AND CT TODAY. I WILL CONTACT YOU WITH YOUR RESULTS TODAY. PT DECLINED WORK EXCUSE. DRINK FLUIDS TYLENOL PRN CHANGE TO FULL LIQUID DIET FOLLOW UP IN 6 MOS.

## 2019-01-04 NOTE — Telephone Encounter (Signed)
Called patient insurance [employer] and spoke with [place]. Was advised no PA is required for CT. Ref# [id number]

## 2019-01-04 NOTE — Progress Notes (Signed)
ON RECALL  °

## 2019-01-04 NOTE — Telephone Encounter (Signed)
T/C from [facility] . Dr. Oneida Alar, please see CT results.

## 2019-01-06 LAB — URINE CULTURE

## 2019-01-09 NOTE — Progress Notes (Signed)
CC'D TO PCP °

## 2019-01-11 ENCOUNTER — Ambulatory Visit: Payer: Federal, State, Local not specified - PPO | Admitting: Gastroenterology

## 2019-02-05 ENCOUNTER — Other Ambulatory Visit: Payer: Self-pay | Admitting: Gastroenterology

## 2019-03-07 NOTE — Progress Notes (Signed)
REVIEWED-NO ADDITIONAL RECOMMENDATIONS. 

## 2019-03-08 ENCOUNTER — Ambulatory Visit: Payer: Federal, State, Local not specified - PPO | Admitting: Gastroenterology

## 2019-06-06 ENCOUNTER — Encounter: Payer: Self-pay | Admitting: Gastroenterology

## 2019-09-27 ENCOUNTER — Other Ambulatory Visit: Payer: Self-pay | Admitting: Gastroenterology

## 2020-02-07 ENCOUNTER — Other Ambulatory Visit: Payer: Self-pay

## 2020-02-07 ENCOUNTER — Ambulatory Visit: Payer: Federal, State, Local not specified - PPO | Admitting: Gastroenterology

## 2020-02-07 ENCOUNTER — Encounter: Payer: Self-pay | Admitting: Gastroenterology

## 2020-02-07 DIAGNOSIS — K219 Gastro-esophageal reflux disease without esophagitis: Secondary | ICD-10-CM

## 2020-02-07 MED ORDER — ESOMEPRAZOLE MAGNESIUM 40 MG PO CPDR: DELAYED_RELEASE_CAPSULE | ORAL | 3 refills | Status: DC

## 2020-02-07 NOTE — Progress Notes (Signed)
Subjective:    Patient ID: Tamara Lynch, female    DOB: 11/04/1976, 44 y.o.   MRN: [id number]  Yvone Neu, MD  HPI CHANGE IN BOWEL IN HABITS: IBS-D NOW IBS-C. IF NEED TO HAVE A BM, EATS A TRIGGER FOOD. CHANGE HAPPENED SEP 2020. HAD MONO OCT 2020. DOESN'T REALLY BOTHER HER. CLEAR HER THROAT A LOT. HAVING HEARTBURN: 2-3 TIMES A WEEK. SYMPTOMS LASTS HOURS. BURPS AND FOOD COMES UP ONCE A WEEK. HAS TO THINK ABOUT SWALLOWING SOME FOODS. BMs:  QOD. DOESN'T WANT ANY MEDS. NEXIUM BID. USES ROLAIDS: 3X/WEEK. NO WEIGHT LOSS. NOT AS HUNGRY. TRYING TO LOSE WEIGHT: PORTION SIZE(FEB 2021), SODAS(JAN 2021), AND SUGAR RESTRICTION(MON).    PT DENIES FEVER, CHILLS, HEMATOCHEZIA, HEMATEMESIS, nausea, vomiting, melena, CHEST PAIN, SHORTNESS OF BREATH,  OR abdominal pain.   Past Medical History:  Diagnosis Date  . Anxiety   . Depression   . GERD (gastroesophageal reflux disease)   . IBS (irritable bowel syndrome)   . 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 PONV (postoperative nausea and vomiting)   . Sleep apnea     Past Surgical History:  Procedure Laterality Date  . APPENDECTOMY    . BIOPSY N/A 08/06/2015   Procedure: BIOPSY;  Surgeon: Danie Binder, MD;  Location: [facility];  Service: Endoscopy;  Laterality: N/A;  gastric and duodenal  . CHOLECYSTECTOMY  2011  . COLONOSCOPY  2006   Dr. Algis Greenhouse: normal colonoscopy, Biopsy showed mild chronic inflammation. No evidence of microscopic colitis.  Marland Kitchen ESOPHAGEAL DILATION N/A 08/06/2015   Procedure:  ESOPHAGEAL DILATION;  Surgeon: Danie Binder, MD;  Location: [facility];  Service: Endoscopy;  Laterality: N/A;  [facility] 15/16  . ESOPHAGOGASTRODUODENOSCOPY  2011   Dr. Algis Greenhouse. Propofol. hiatal hernia. No Barrett's.  . ESOPHAGOGASTRODUODENOSCOPY  2010   Dr. Algis Greenhouse: propofol.hiatal hernia and erosions. [id number] chronic gastritis/esophagitis  . ESOPHAGOGASTRODUODENOSCOPY  2006   Dr. Docia Furl: normal upper  endoscopy. Biopsies of the duodenum negative for celiac disease.TTG also normal  . ESOPHAGOGASTRODUODENOSCOPY (EGD) WITH PROPOFOL N/A 08/06/2015   SLF: Dysphagia due to possible proximal esophageal web and/or reflux 2. Few gastric polyps 3. Dyspepsia due to mild non-erosive gastriitis and GERD   . NASAL SEPTUM SURGERY  2002  . POLYPECTOMY N/A 08/06/2015   Procedure: POLYPECTOMY;  Surgeon: Danie Binder, MD;  Location: [facility];  Service: Endoscopy;  Laterality: N/A;  gastric  . TONSILLECTOMY  2012   Allergies  Allergen Reactions  . Biaxin [Clarithromycin]   . Lidocaine Viscous Hcl     THROAT PAIN/BLISTERS IN MOUTH  . Tequin [Gatifloxacin]   . Viberzi [Eluxadoline]     Tingling from head to toe   Current Outpatient Medications  Medication Sig    . cabergoline (DOSTINEX) 0.5 MG tablet Take 0.25 mg by mouth 2 (two) times a week.    Marland Kitchen CALCIUM PO Take by mouth daily.    . carvedilol (COREG) 25 MG tablet Take 25 mg by mouth 2 (two) times daily with a meal.    . Cholecalciferol (VITAMIN D3) 1.25 MG (50000 UT) CAPS Take 1 capsule by mouth once a week.    . esomeprazole (NEXIUM) 40 MG capsule TAKE 1 CAPSULE TWICE DAILY BEFORE MEALS    . losartan (COZAAR) 100 MG tablet Take 100 mg by mouth daily.    Marland Kitchen MAGNESIUM PO Take by mouth daily.    Marland Kitchen PRISTIQ 50 MG 24 hr tablet Take 50 mg by mouth daily.  Review of Systems PER HPI OTHERWISE ALL SYSTEMS ARE NEGATIVE.    Objective:   Physical Exam Constitutional:      General: She is not in acute distress.    Appearance: Normal appearance.  HENT:     Mouth/Throat:     Comments: MASK IN PLACE Eyes:     General: No scleral icterus.    Pupils: Pupils are equal, round, and reactive to light.  Cardiovascular:     Rate and Rhythm: Normal rate and regular rhythm.     Pulses: Normal pulses.     Heart sounds: Normal heart sounds.  Pulmonary:     Effort: Pulmonary effort is normal.     Breath sounds: Normal breath sounds.  Abdominal:     General:  Bowel sounds are normal.     Palpations: Abdomen is soft.     Tenderness: There is no abdominal tenderness.  Musculoskeletal:     Cervical back: Normal range of motion.     Right lower leg: No edema.     Left lower leg: No edema.  Lymphadenopathy:     Cervical: No cervical adenopathy.  Skin:    General: Skin is warm and dry.  Neurological:     Mental Status: She is alert and oriented to person, place, and time.     Comments: NO  NEW FOCAL DEFICITS  Psychiatric:        Mood and Affect: Mood normal.     Comments: NORMAL AFFECT       Assessment & Plan:

## 2020-02-07 NOTE — Patient Instructions (Addendum)
EAT TO LIVE AND THINK OF FOOD AS MEDICINE. 75% OF YOUR PLATE SHOULD BE FRUITS/VEGGIES.   To have more energy, and to lose weight:      1. CONTINUE YOUR WEIGHT LOSS EFFORTS. I RECOMMEND YOU READ AND FOLLOW RECOMMENDATIONS BY DR. MARK HYMAN, "10-DAY DETOX DIET".    2. If you must eat bread, EAT EZEKIEL BREAD. IT IS IN THE FROZEN SECTION OF THE GROCERY STORE.    3. DRINK WATER WITH FRUIT OR CUCUMBER ADDED. YOUR URINE SHOULD BE LIGHT YELLOW. AVOID SODA, GATORADE, ENERGY DRINKS, OR DIET SODA.     4. AVOID HIGH FRUCTOSE CORN SYRUP AND CAFFEINE.     5. DO NOT chew SUGAR FREE GUM OR USE ARTIFICIAL SWEETENERS. IF NEEDED USE STEVIA AS A SWEETENER.    6. DO NOT EAT ENRICHED WHEAT FLOUR, PASTA, RICE, OR CEREAL.    7. ONLY EAT WILD CAUGHT SEAFOOD, GRASS FED BEEF OR CHICKEN, PORK FROM PASTURE RAISE PIGS, OR EGGS FROM PASTURE RAISED CHICKENS.    8. PRACTICE CHAIR YOGA FOR 15-30 MINS 3 OR 4 TIMES A WEEK AND PROGRESS TO HATHA YOGA OVER NEXT 6 MOS. CONTINUE YOUR RECUMBENT BIKE.    9. START TAKING A MULTIVITAMIN, AND VITAMIN B12 DAILY.  CONTINUE VITAMIN D.   FOR 3 MOS, TAKE THESE ADDITIONAL SUPPLEMENTS TO DECREASE CRAVING AND SUPPRESS YOUR APPETITE:    1. CINNAMON 500 MG EVERY AM PRIOR TO FIRST MEAL.   **STABILIZES BLOOD GLUCOSE/REDUCES CRAVINGS**    2. CHROMIUM 500 MG WITH MEALS TWICE DAILY WITH MEALS.    **FAT BURNER**    3. GREEN TEA EXTRACT ONE DAILY WITH MEAL.   **FAT BURNER/SUPPRESSES YOUR APPETITE**    4. ALPHA LIPOIC ACID 500 MG TWICE DAILY WITH MEALS.   **NATURAL ANTI-INFLAMMATORY SUPPLEMENT THAT IS AN ALTERNATIVE TO IBUPROFEN OR NAPROXEN**  FOLLOW UP IN 6-12 MOS.

## 2020-02-08 ENCOUNTER — Other Ambulatory Visit: Payer: Self-pay

## 2020-02-08 ENCOUNTER — Telehealth: Payer: Self-pay

## 2020-02-08 NOTE — Telephone Encounter (Signed)
SLF ordered nutrition consult in [place], [place] yesterday at [facility]. Referral faxed to [facility].

## 2020-02-08 NOTE — Assessment & Plan Note (Addendum)
SYMPTOMS FAIRLY WELL CONTROLLED.  CONTINUE NEXIUM. TAKE 30 MINUTES BEFORE MEALS TWICE DAILY.  EAT TO LIVE AND THINK OF FOOD AS MEDICINE. 75% OF YOUR PLATE SHOULD BE FRUITS/VEGGIES.  To have more energy, and to lose weight:     1. CONTINUE YOUR WEIGHT LOSS EFFORTS. I RECOMMEND YOU READ AND FOLLOW RECOMMENDATIONS BY DR. MARK HYMAN, "10-DAY DETOX DIET".   2. If you must eat bread, EAT EZEKIEL BREAD. IT IS IN THE FROZEN SECTION OF THE GROCERY STORE.   3. DRINK WATER WITH FRUIT OR CUCUMBER ADDED. YOUR URINE SHOULD BE LIGHT YELLOW. AVOID SODA, GATORADE, ENERGY DRINKS, OR DIET SODA.    4. AVOID HIGH FRUCTOSE CORN SYRUP AND CAFFEINE.    5. DO NOT chew SUGAR FREE GUM OR USE ARTIFICIAL SWEETENERS. IF NEEDED USE STEVIA AS A SWEETENER.   6. DO NOT EAT ENRICHED WHEAT FLOUR, PASTA, RICE, OR CEREAL.   7. ONLY EAT WILD CAUGHT SEAFOOD, GRASS FED BEEF OR CHICKEN, PORK FROM PASTURE RAISE PIGS, OR EGGS FROM PASTURE RAISED CHICKENS.   8. PRACTICE CHAIR YOGA FOR 15-30 MINS 3 OR 4 TIMES A WEEK AND PROGRESS TO HATHA YOGA OVER NEXT 6 MOS. CONTINUE YOUR RECUMBENT BIKE.   9. START TAKING A MULTIVITAMIN, AND VITAMIN B12 DAILY.  CONTINUE VITAMIN D.   FOR 3 MOS, TAKE THESE ADDITIONAL SUPPLEMENTS TO DECREASE CRAVING AND SUPPRESS YOUR APPETITE:    1. CINNAMON 500 MG EVERY AM PRIOR TO FIRST MEAL.   **STABILIZES BLOOD GLUCOSE/REDUCES CRAVINGS**   2. CHROMIUM 500 MG WITH MEALS TWICE DAILY WITH MEALS.    **FAT BURNER**   3. GREEN TEA EXTRACT ONE DAILY WITH MEAL.   **FAT BURNER/SUPPRESSES YOUR APPETITE**   4. ALPHA LIPOIC ACID 500 MG TWICE DAILY WITH MEALS.   **NATURAL ANTI-INFLAMMATORY SUPPLEMENT THAT IS AN ALTERNATIVE TO IBUPROFEN OR NAPROXEN**  FOLLOW UP IN 6-12 MOS.

## 2020-02-27 ENCOUNTER — Telehealth: Payer: Self-pay

## 2020-02-27 NOTE — Telephone Encounter (Signed)
PA for Nexium has been submitted through [facility]. Waiting on an approval or denial.

## 2020-03-05 NOTE — Telephone Encounter (Signed)
PA was approved for esemeprazole through 02/28/2021. Approval letter will be scanned in chart.

## 2020-07-17 ENCOUNTER — Other Ambulatory Visit: Payer: Self-pay

## 2020-07-17 ENCOUNTER — Encounter: Payer: Self-pay | Admitting: Gastroenterology

## 2020-07-17 ENCOUNTER — Ambulatory Visit: Payer: Federal, State, Local not specified - PPO | Admitting: Gastroenterology

## 2020-07-17 VITALS — BP 123/83 | HR 73 | Temp 97.3°F | Ht 62.0 in | Wt 262.0 lb

## 2020-07-17 DIAGNOSIS — K219 Gastro-esophageal reflux disease without esophagitis: Secondary | ICD-10-CM | POA: Diagnosis not present

## 2020-07-17 NOTE — Progress Notes (Signed)
Referring Provider: Yvone Neu Primary Care Physician:  Yvone Neu, MD  Primary GI: Dr. Abbey Chatters   Chief Complaint  Patient presents with  . Gastroesophageal Reflux    f/u. Doing okay    HPI:   Tamara Lynch is a 44 y.o. female presenting today with a history of GERD, here for routine follow-up.   Nexium BID. No dysphagia. No constipation. No rectal bleeding. Chronic underlying abdominal pain. At baseline. Good appetite. If needs help having BM, will eat a raw veggie. Feels best she has felt in awhile.   Past Medical History:  Diagnosis Date  . Anxiety   . Depression   . GERD (gastroesophageal reflux disease)   . IBS (irritable bowel syndrome)   . 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 PONV (postoperative nausea and vomiting)   . Sleep apnea     Past Surgical History:  Procedure Laterality Date  . APPENDECTOMY    . BIOPSY N/A 08/06/2015   Procedure: BIOPSY;  Surgeon: Danie Binder, MD;  Location: [facility];  Service: Endoscopy;  Laterality: N/A;  gastric and duodenal  . CHOLECYSTECTOMY  2011  . COLONOSCOPY  2006   Dr. Algis Greenhouse: normal colonoscopy, Biopsy showed mild chronic inflammation. No evidence of microscopic colitis.  Marland Kitchen ESOPHAGEAL DILATION N/A 08/06/2015   Procedure:  ESOPHAGEAL DILATION;  Surgeon: Danie Binder, MD;  Location: [facility];  Service: Endoscopy;  Laterality: N/A;  [place] 15/16  . ESOPHAGOGASTRODUODENOSCOPY  2011   Dr. Algis Greenhouse. Propofol. hiatal hernia. No Barrett's.  . ESOPHAGOGASTRODUODENOSCOPY  2010   Dr. Algis Greenhouse: propofol.hiatal hernia and erosions. XI:PJAS chronic gastritis/esophagitis  . ESOPHAGOGASTRODUODENOSCOPY  2006   Dr. Docia Furl: normal upper endoscopy. Biopsies of the duodenum negative for celiac disease.TTG also normal  . ESOPHAGOGASTRODUODENOSCOPY (EGD) WITH PROPOFOL N/A 08/06/2015   SLF: Dysphagia due to possible proximal esophageal web and/or reflux 2. Few gastric  polyps 3. Dyspepsia due to mild non-erosive gastriitis and GERD   . NASAL SEPTUM SURGERY  2002  . POLYPECTOMY N/A 08/06/2015   Procedure: POLYPECTOMY;  Surgeon: Danie Binder, MD;  Location: [facility];  Service: Endoscopy;  Laterality: N/A;  gastric  . TONSILLECTOMY  2012    Current Outpatient Medications  Medication Sig Dispense Refill  . Ascorbic Acid (VITA-C PO) Take by mouth daily.    . cabergoline (DOSTINEX) 0.5 MG tablet Take 0.25 mg by mouth 2 (two) times a week.    Marland Kitchen CALCIUM PO Take by mouth daily.    . carvedilol (COREG) 25 MG tablet Take 25 mg by mouth 2 (two) times daily with a meal.    . Cholecalciferol (VITAMIN D3) 1.25 MG (50000 UT) CAPS Take 1 capsule by mouth once a week.  2  . CINNAMON PO Take by mouth daily.    Marland Kitchen esomeprazole (NEXIUM) 40 MG capsule TAKE 1 CAPSULE TWICE DAILY BEFORE MEALS 180 capsule 3  . Green Tea, Camellia sinensis, (GREEN TEA EXTRACT PO) Take by mouth daily.    Marland Kitchen lactobacillus acidophilus (BACID) TABS tablet Take 1 tablet by mouth daily.    Marland Kitchen losartan (COZAAR) 100 MG tablet Take 100 mg by mouth daily.  6  . MAGNESIUM PO Take by mouth daily.    Marland Kitchen PRISTIQ 50 MG 24 hr tablet Take 50 mg by mouth daily.      No current facility-administered medications for this visit.    Allergies as of 07/17/2020 - Review Complete 07/17/2020  Allergen  Reaction Noted  . Biaxin [clarithromycin]  06/19/2015  . Lidocaine viscous hcl  08/12/2015  . Tequin [gatifloxacin]  06/19/2015  . Viberzi [eluxadoline]  08/02/2015    Family History  Problem Relation Age of Onset  . Barrett's esophagus Sister   . Cervical cancer Mother   . Breast cancer Mother   . Heart attack Father        age 61, deceased  . Colon cancer Neg Hx   . Colon polyps Neg Hx     Social History   Socioeconomic History  . Marital status: Married    Spouse name: Not on file  . Number of children: 2  . Years of education: Not on file  . Highest education level: Not on file  Occupational History    . Occupation: Teacher, adult education  Tobacco Use  . Smoking status: Never Smoker  . Smokeless tobacco: Never Used  . Tobacco comment: Never smoked  Substance and Sexual Activity  . Alcohol use: No    Alcohol/week: 0.0 standard drinks  . Drug use: No  . Sexual activity: Not on file  Other Topics Concern  . Not on file  Social History Narrative  . Not on file   Social Determinants of Health   Financial Resource Strain:   . Difficulty of Paying Living Expenses: Not on file  Food Insecurity:   . Worried About Charity fundraiser in the Last Year: Not on file  . Ran Out of Food in the Last Year: Not on file  Transportation Needs:   . Lack of Transportation (Medical): Not on file  . Lack of Transportation (Non-Medical): Not on file  Physical Activity:   . Days of Exercise per Week: Not on file  . Minutes of Exercise per Session: Not on file  Stress:   . Feeling of Stress : Not on file  Social Connections:   . Frequency of Communication with Friends and Family: Not on file  . Frequency of Social Gatherings with Friends and Family: Not on file  . Attends Religious Services: Not on file  . Active Member of Clubs or Organizations: Not on file  . Attends Archivist Meetings: Not on file  . Marital Status: Not on file    Review of Systems: Gen: Denies fever, chills, anorexia. Denies fatigue, weakness, weight loss.  CV: Denies chest pain, palpitations, syncope, peripheral edema, and claudication. Resp: Denies dyspnea at rest, cough, wheezing, coughing up blood, and pleurisy. GI: see HPI Derm: Denies rash, itching, dry skin Psych: Denies depression, anxiety, memory loss, confusion. No homicidal or suicidal ideation.  Heme: Denies bruising, bleeding, and enlarged lymph nodes.  Physical Exam: BP 123/83   Pulse 73   Temp (!) 97.3 F (36.3 C) (Oral)   Ht 5\' 2"  (1.575 m)   Wt 262 lb (118.8 kg)   LMP 07/01/2020   BMI 47.92 kg/m  General:   Alert and  oriented. No distress noted. Pleasant and cooperative.  Head:  Normocephalic and atraumatic. Eyes:  Conjuctiva clear without scleral icterus. Mouth:  Mask in place Abdomen:  +BS, soft, non-tender and non-distended. No rebound or guarding. No HSM or masses noted. Msk:  Symmetrical without gross deformities. Normal posture. Extremities:  Without edema. Neurologic:  Alert and  oriented x4 Psych:  Alert and cooperative. Normal mood and affect.  ASSESSMENT: Tamara Lynch is a 44 y.o. female presenting today with history of chronic GERD, doing well on Nexium BID, which has worked best for her historically.  No concerning lower or upper GI signs/symptoms.    PLAN:  Continue Nexium BID  Return in 1 year  Call if any problems in interim.  Annitta Needs, PhD, ANP-BC [facility]

## 2020-07-17 NOTE — Patient Instructions (Signed)
We will see you back in 1 year or sooner if needed.  Please call with any problems in the meantime!  I enjoyed seeing you again today! As you know, I value our relationship and want to provide genuine, compassionate, and quality care. I welcome your feedback. If you receive a survey regarding your visit,  I greatly appreciate you taking time to fill this out. See you next time!  Annitta Needs, PhD, ANP-BC [facility]

## 2020-07-25 IMAGING — CT CT RENAL STONE PROTOCOL
2 of 4 series · 16 of 46 positions shown, 18 images · non-contrast
Comparison: 05/03/2017

CLINICAL DATA: Left-sided abdominal pain for 2 days with nausea and
diarrhea. Remote history of renal stones.

EXAM:
CT ABDOMEN AND PELVIS WITHOUT CONTRAST
TECHNIQUE: Multidetector CT imaging of the abdomen and pelvis was performed
following the standard protocol without IV contrast.

[Series 2: axial st · axial · 0.90mm/px · z∈[+1031,+1476]mm · 13 of 99 slices shown, 15 images]
[im 5/99  soft-tissue]
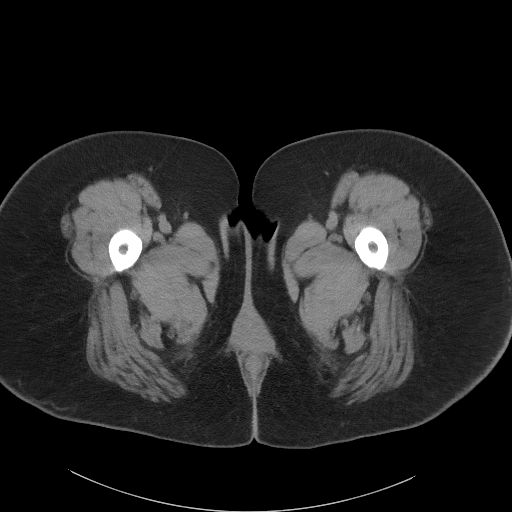
[im 5/99  bone]
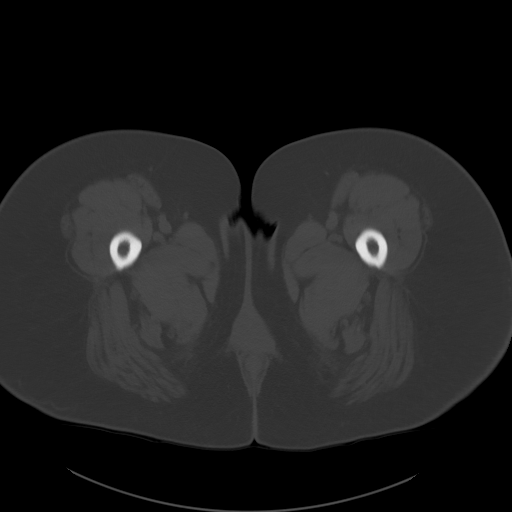
[im 13/99  soft-tissue]
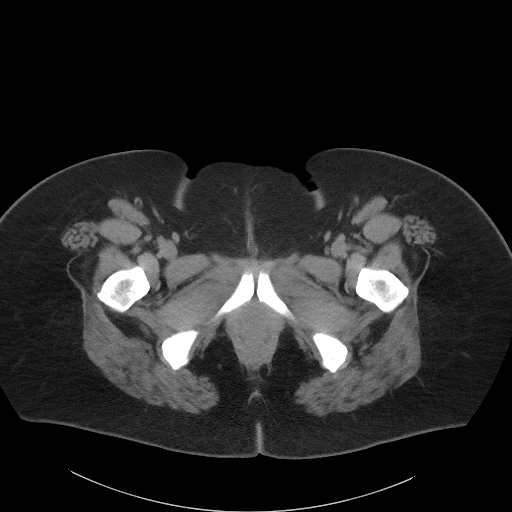
[im 21/99  soft-tissue]
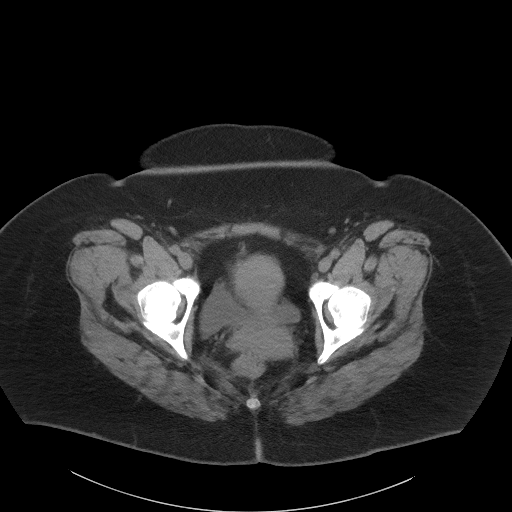
[im 29/99  soft-tissue]
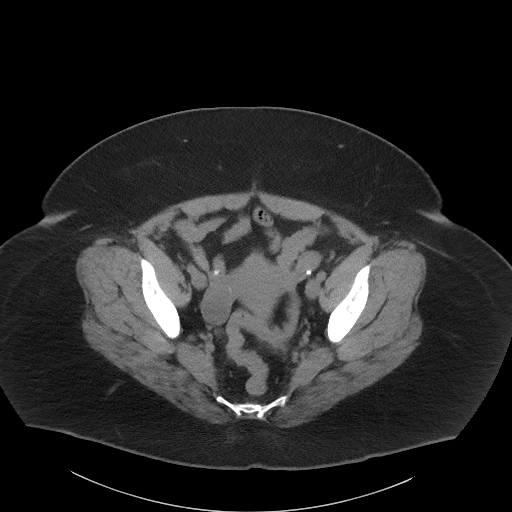
[im 33/99  soft-tissue]
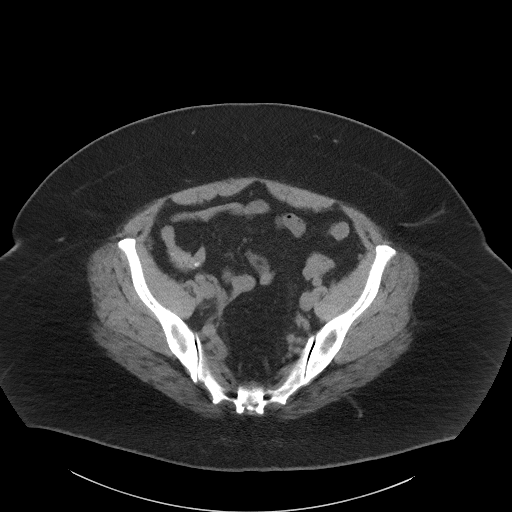
[im 41/99  soft-tissue]
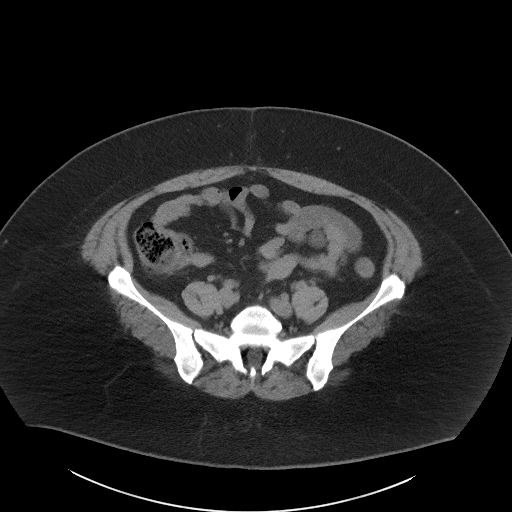
[im 50/99  soft-tissue]
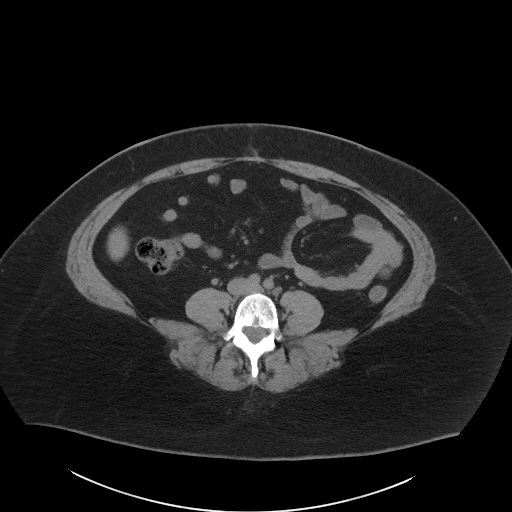
[im 58/99  soft-tissue]
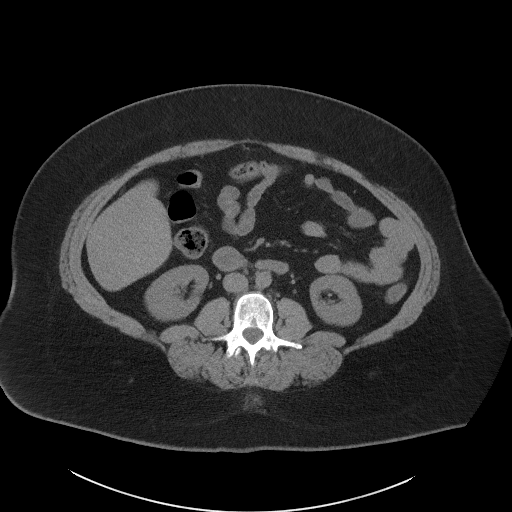
[im 66/99  soft-tissue]
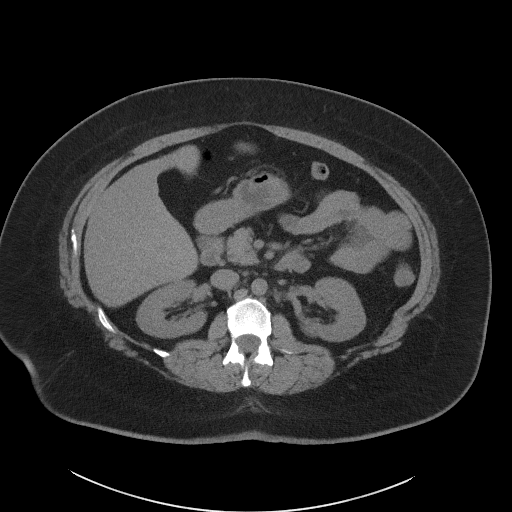
[im 66/99  bone]
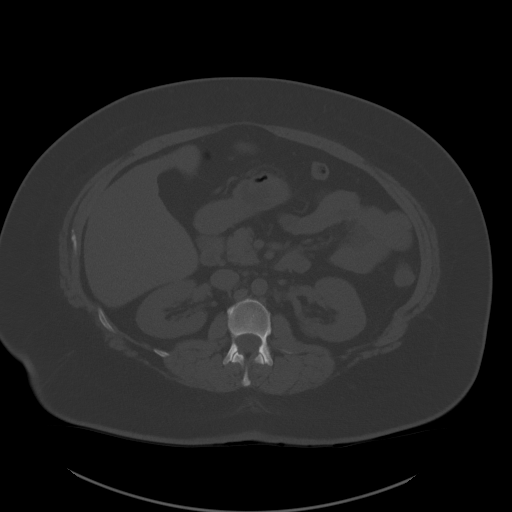
[im 70/99  soft-tissue]
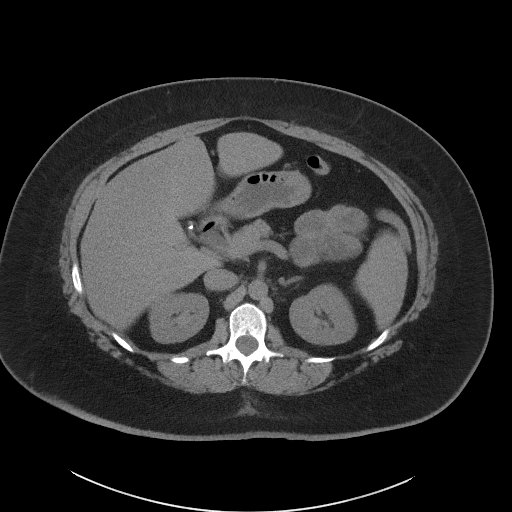
[im 78/99  soft-tissue]
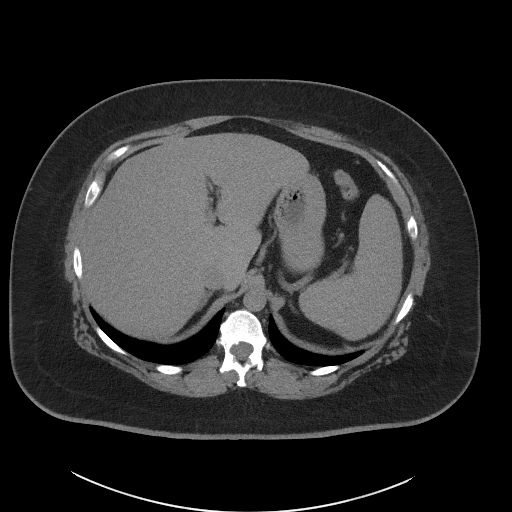
[im 86/99  soft-tissue]
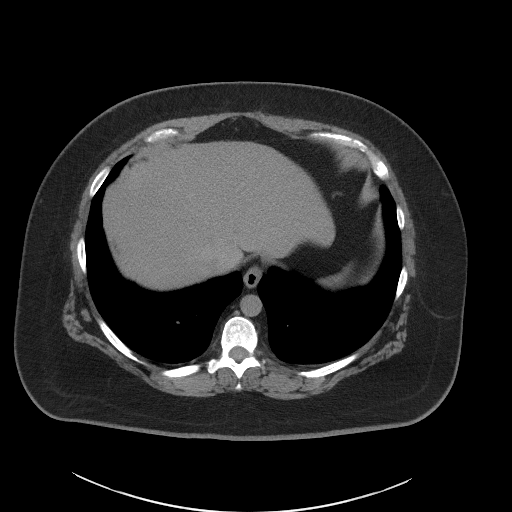
[im 94/99  soft-tissue]
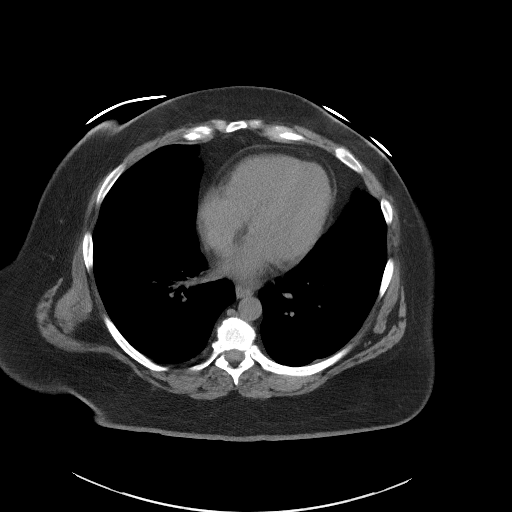

[Series 5: coronal st · coronal · 0.79mm/px · 3 of 94 slices shown]
[im 32/94  soft-tissue]
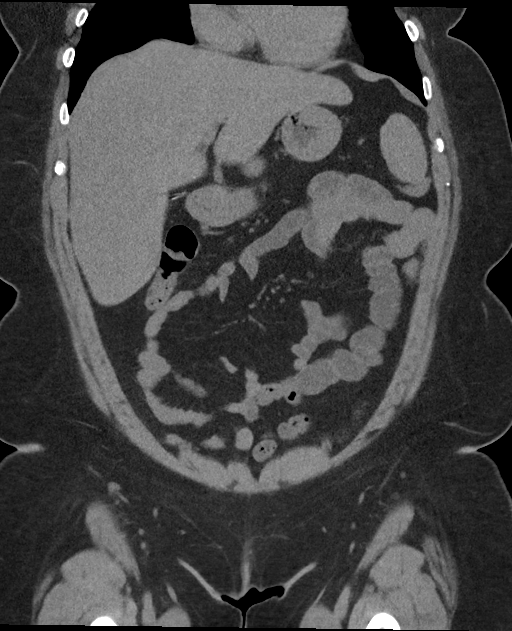
[im 42/94  soft-tissue]
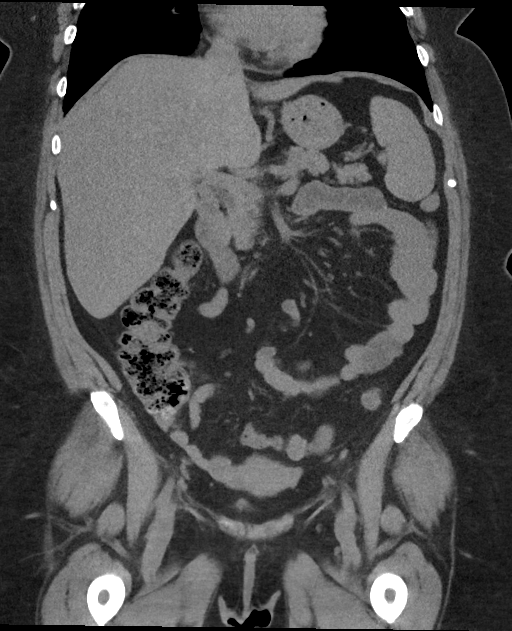
[im 52/94  soft-tissue]
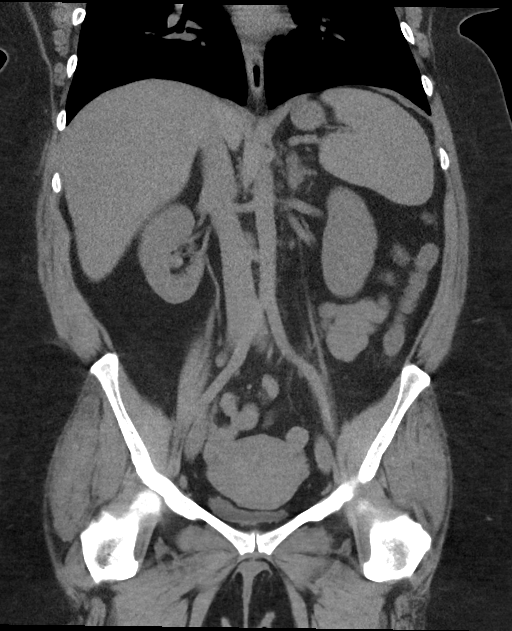

[16 of 46 positions shown; findings below may reference images not displayed]

FINDINGS: Lower chest: Unremarkable.

Hepatobiliary: The liver shows diffusely decreased attenuation
suggesting steatosis. Gallbladder surgically absent. No intrahepatic
or extrahepatic biliary dilation.

Pancreas: No focal mass lesion. No dilatation of the main duct. No
intraparenchymal cyst. No peripancreatic edema.

Spleen: No splenomegaly. No focal mass lesion.

Adrenals/Urinary Tract: No adrenal nodule or mass. No stones are
seen in either kidney. No ureteral stones. No secondary changes in
either kidney or ureter. The urinary bladder appears normal for the
degree of distention.

Stomach/Bowel: Stomach is unremarkable. No gastric wall thickening.
No evidence of outlet obstruction. Duodenum is normally positioned
as is the ligament of Treitz. No small bowel wall thickening. No
small bowel dilatation. The terminal ileum is normal.
Nonvisualization of the appendix is consistent with the reported
history of appendectomy. No gross colonic mass. No colonic wall
thickening.

Vascular/Lymphatic: No abdominal aortic aneurysm. No abdominal
aortic atherosclerotic calcification. There is no gastrohepatic or
hepatoduodenal ligament lymphadenopathy. No intraperitoneal or
retroperitoneal lymphadenopathy. No pelvic sidewall lymphadenopathy.

Reproductive: The uterus is unremarkable. Left ovary unremarkable.
2.7 cm benign appearing cystic lesion noted right ovary.

Other: No intraperitoneal free fluid.

Musculoskeletal: No worrisome lytic or sclerotic osseous
abnormality.
IMPRESSION: 1. No acute findings in the abdomen or pelvis. Specifically, no
evidence for urinary stone disease. No secondary changes in the left
kidney or ureter. No findings to explain the patient's history of
left-sided abdominal pain with nausea and diarrhea.
2. 2.7 cm benign appearing cyst in the right ovary.

## 2021-02-28 ENCOUNTER — Other Ambulatory Visit: Payer: Self-pay

## 2021-03-03 MED ORDER — ESOMEPRAZOLE MAGNESIUM 40 MG PO CPDR: DELAYED_RELEASE_CAPSULE | ORAL | 1 refills | Status: DC

## 2021-03-11 ENCOUNTER — Telehealth: Payer: Self-pay

## 2021-03-11 NOTE — Telephone Encounter (Signed)
PA done for Proton Pump Inhibitors (PPI) for Esomeprazole Magnesium( generic of Nexium). Dx: GERD--IBS--esophageal Dysphagia, ---- pt has tried and failed Aciphex--Dexilant--Prilosec/prilosec OTC--Protonix.

## 2021-03-12 ENCOUNTER — Telehealth: Payer: Self-pay

## 2021-03-12 NOTE — Telephone Encounter (Signed)
The prior authorization request has been approved for esomeprazole magnesium (Nexium) approved from 02/09/2021--03/11/2022. Will give to Tamara Lynch to scan into chart. Will call pt's pharmacy to make them aware as well as the pt.

## 2021-06-24 ENCOUNTER — Encounter: Payer: Self-pay | Admitting: Internal Medicine

## 2021-11-21 ENCOUNTER — Other Ambulatory Visit: Payer: Self-pay | Admitting: Gastroenterology

## 2022-03-03 ENCOUNTER — Telehealth: Payer: Self-pay

## 2022-03-03 NOTE — Telephone Encounter (Signed)
Pt's Esomeprazole Magnesium 40 mg DR cap has been approved  valid from 02/01/2022 through 03/03/2023. Sent to scan for pt's chart. ? ?Dx: K21.9 GERD, R13.10 Dysphagia  ?

## 2022-10-08 ENCOUNTER — Other Ambulatory Visit: Payer: Self-pay | Admitting: Gastroenterology

## 2022-10-21 NOTE — Progress Notes (Signed)
GI Office Note    Referring Provider: Yvone Neu Primary Care Physician:  Yvone Neu, MD Primary Gastroenterologist: Elon Alas. Abbey Chatters, DO   Date:  10/22/2022  ID:  Mertie Clause, DOB Oct 06, 1976, MRN [id number]   Chief Complaint   Chief Complaint  Patient presents with   Abdominal Pain    Left side abdominal pain for a couple of months.    History of Present Illness  Avilyn Virtue is a 47 y.o. female with a history of anxiety, depression, GERD, IBS, pituitary tumor presenting today with complaint of left sided abdominal pain.  Colonoscopy 08/04/2005: -normal per Dr. Donnajean Lopes -random colon biopsies performed. -mild chronic inflammation of colon and duodenum.   EGD 08/06/15: -normal esophagus s/p dilation -small hiatal hernia -mild non erosive gastritis (reactive gastropathy) -gastric polyps s/p biopsy (benign) -carafate as needed, nexium BID, lose weight, eat smaller meals -Normal duodenum s/p biopsy. (Normal)   Last office visit 07/17/20. Doing well on nexium BID. No rectal bleeding. Chronic abdominal pain. Good appetite. Eat raw veggies to have a BM. Advised f/u in 1 year.   Today: Abdominal pain - left upper. Has been going on since July but now she is unable to sleep on that side. Appetite and change in bowel habits. Unable to eat as much food. Used to have IBS-D: lettuce and fatty foods used to bother her. Use to have loose stools and would go 6 times per day and now may go once per day. Pain does not improve with bowel movements. Has a fullness feeling/pressure feeling and finds herself pushing down on her upper abdomen. When trying to sleep pain is 5/10. Sitting down pain is 2/10. Times she has nausea but not on a regular basis. Does report some burning sensation in her upper abdomen after taking cold medicine. Possibly does have some burning with spicy foods. Tries to stay away from milk but does eat cheese. Does admit to a high amount of stress  recently. Denies frequent NSAID use. Does take motrin about once or twice per week associated mostly with menstrual cycle. Earlier this year she was taking more motrin due to knee pain. Has tried other PPIs in the past and this seems to work the best for her. Not taking any extra fiber. Deneis melena or BRBPR.   Since her cholecystectomy she has had yellow stools.   Has bad lumbar disks and knee pain. 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   Does not have a family history of colon cancer. Sister had colon polyps. Denies chest pain, shortens of breath, edema.    Current Outpatient Medications  Medication Sig Dispense Refill   Ascorbic Acid (VITA-C PO) Take by mouth daily.     busPIRone (BUSPAR) 5 MG tablet Take 1 tablet by mouth 3 (three) times daily.     cabergoline (DOSTINEX) 0.5 MG tablet Take 0.25 mg by mouth 2 (two) times a week.     carvedilol (COREG) 25 MG tablet Take 25 mg by mouth 2 (two) times daily with a meal.     Cholecalciferol (VITAMIN D3) 1.25 MG (50000 UT) CAPS Take 1 capsule by mouth once a week.  2   diazepam (VALIUM) 5 MG tablet Take 1 tablet by mouth daily as needed.     esomeprazole (NEXIUM) 40 MG capsule TAKE 1 CAPSULE TWICE DAILY BEFORE MEALS 180 capsule 1   gabapentin (NEURONTIN) 100 MG capsule Take 1 capsule by mouth 2 (two) times daily.  Green Tea, Camellia sinensis, (GREEN TEA EXTRACT PO) Take by mouth daily.     losartan (COZAAR) 100 MG tablet Take 100 mg by mouth daily.  6   MAGNESIUM PO Take by mouth daily.     PRISTIQ 50 MG 24 hr tablet Take 50 mg by mouth daily.      sucralfate (CARAFATE) 1 g tablet Take 1 tablet (1 g total) by mouth 4 (four) times daily -  with meals and at bedtime. 60 tablet 1   venlafaxine XR (EFFEXOR XR) 75 MG 24 hr capsule Take by mouth.     No current facility-administered medications for this visit.    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 Algis Greenhouse: normal colonoscopy, Biopsy showed mild chronic inflammation. No evidence of microscopic colitis.   ESOPHAGEAL DILATION N/A 08/06/2015   Procedure:  ESOPHAGEAL DILATION;  Surgeon: Danie Binder, MD;  Location: [facility];  Service: Endoscopy;  Laterality: N/AAzzie Almas 15/16   ESOPHAGOGASTRODUODENOSCOPY  2011   Dr. Algis Greenhouse. Propofol. hiatal hernia. No Barrett's.   ESOPHAGOGASTRODUODENOSCOPY  2010   Dr. Algis Greenhouse: propofol.hiatal hernia and erosions. LF:YBOF chronic gastritis/esophagitis   ESOPHAGOGASTRODUODENOSCOPY  2006   Dr. Docia Furl: normal upper endoscopy. Biopsies of the duodenum negative for celiac disease.TTG also normal   ESOPHAGOGASTRODUODENOSCOPY (EGD) WITH PROPOFOL N/A 08/06/2015   SLF: Dysphagia due to possible proximal esophageal web and/or reflux 2. Few gastric polyps 3. Dyspepsia due to mild non-erosive gastriitis and GERD    NASAL SEPTUM SURGERY  2002   POLYPECTOMY N/A 08/06/2015   Procedure: POLYPECTOMY;  Surgeon: Danie Binder, MD;  Location: [facility];  Service: Endoscopy;  Laterality: N/A;  gastric   TONSILLECTOMY  2012    Family History  Problem Relation Age of Onset   Barrett's esophagus Sister    Cervical cancer Mother    Breast cancer Mother    Heart attack Father        age 41, deceased   Colon cancer Neg Hx    Colon polyps Neg Hx     Allergies as of 10/22/2022 - Review Complete 10/22/2022  Allergen Reaction Noted   Biaxin [clarithromycin]  06/19/2015   Lidocaine viscous hcl   08/12/2015   Tequin [gatifloxacin]  06/19/2015   Viberzi [eluxadoline]  08/02/2015    Social History   Socioeconomic History   Marital status: Married    Spouse name: Not on file   Number of children: 2   Years of education: Not on file   Highest education level: Not on file  Occupational History   Occupation: Teacher, adult education  Tobacco Use   Smoking status: Never   Smokeless tobacco: Never   Tobacco comments:    Never smoked  Substance and Sexual Activity   Alcohol use: No    Alcohol/week: 0.0 standard drinks of alcohol   Drug use: No   Sexual activity: Not on  file  Other Topics Concern   Not on file  Social History Narrative   Not on file   Social Determinants of Health   Financial Resource Strain: Not on file  Food Insecurity: Not on file  Transportation Needs: Not on file  Physical Activity: Not on file  Stress: Not on file  Social Connections: Not on file     Review of Systems   Gen: Denies fever, chills, anorexia. Denies fatigue, weakness, weight loss.  CV: Denies chest pain, palpitations, syncope, peripheral edema, and claudication. Resp: Denies dyspnea at rest, cough, wheezing, coughing up blood, and pleurisy. GI: See HPI Derm: Denies rash, itching, dry skin Psych: Denies depression, anxiety, memory loss, confusion. No homicidal or suicidal ideation.  Heme: Denies bruising, bleeding, and enlarged lymph nodes.   Physical Exam   BP (!) 147/88 (BP Location: Right Arm, Patient Position: Sitting, Cuff Size: Normal)   Pulse 72   Temp 97.6 F (36.4 C) (Temporal)   Ht '5\' 2"'$  (1.575 m)   Wt 252 lb 12.8 oz (114.7 kg)   LMP 09/30/2022 (Approximate)   SpO2 96%   BMI 46.24 kg/m   General:   Alert and oriented. No distress noted. Pleasant and cooperative.  Head:  Normocephalic and atraumatic. Eyes:  Conjuctiva clear without scleral icterus. Mouth:  Oral mucosa pink and moist. Good dentition. No lesions. Lungs:  Clear to auscultation  bilaterally. No wheezes, rales, or rhonchi. No distress.  Heart:  S1, S2 present without murmurs appreciated.  Abdomen:  +BS, soft, non-distended. TTP to epigastrium and LUQ extending to left side. No rebound or guarding. No HSM or masses noted. Rectal: deferred Extremities:  Without edema. Neurologic:  Alert and  oriented x4 Psych:  Alert and cooperative. Normal mood and affect.   Assessment  Omah Dewalt is a 46 y.o. female with a history of anxiety, depression, GERD, IBS, pituitary tumor presenting today with complaint of left sided abdominal pain.  GERD/LUQ Abdominal pain: Occurring since July when she was on vacation.  Has noticed decreased appetite since that time.  No sharp pain, typically a fullness feeling/pressure to the area.  If sleeping on that side pain is 5/10, just sitting on normal daily basis pain is 2/10.  Has nausea at times.  Pain not dependent on meals.  Does worsen with bending over and other movements.  Denies any diarrhea.  Has actually had a change in bowel habits to constipation since all this began.  Admits to trying other PPIs in the past, has been maintained on Nexium 40 mg twice daily for control of GERD.  Denies frequent NSAID use, however does admit to Motrin once or twice per week usually with menstrual cycle.  Did have some more frequent use of Motrin earlier this year for knee pain but has decreased significantly.  Will obtain basic labs including lipase to rule assist in ruling out any potential pancreatitis.  No recent abdominal imaging on file.  May consider pending lab workup.  Will proceed with EGD given left upper quadrant pain, reflux, and occasional nausea.  Differentials include gastritis, duodenitis, peptic ulcer disease, pancreatitis (however unlikely).  Advised to continue Nexium 40 mg twice daily and will add Carafate 1 g 4 times daily for 2 weeks given abdominal burning.  Change in bowel habits: Typically would have about 5-6 loose stools per day  since she had her gallbladder removed.  Since her abdominal pain began in July, she has been more constipated.  May only go once per day, sometimes  may skip a day.  Stools are not firm/hard but frequency and consistency are different than prior.  Does note a family history of colon polyps in her sister via colonoscopy at age 46.  No family history of colon cancer.  Denies any unintentional weight loss.  Has had decrease in appetite as stated above.  Will check basic labs including lipase and TSH to assess for any cause of change in bowel habits.  Also will proceed with colonoscopy in the setting of change in bowel habits, patient will be 47 in February therefore she is already close to being due for screening purposes.  PLAN   CBC, Cmp, Lipase, TSH Carafate 1 g QID for 2 weeks.  Nexium 40 mg BID GERD diet/lifestyle modifications. Daily fiber supplement with benefiber  Proceed with upper endoscopy and colonoscopy with propofol by Dr. Abbey Chatters in near future: the risks, benefits, and alternatives have been discussed with the patient in detail. The patient states understanding and desires to proceed. ASA 3 If negative lab workup, may consider abdominal imaging for further evaluation if pain worsens Follow up in 2 months.       Venetia Night, MSN, FNP-BC, AGACNP-BC [facility]

## 2022-10-21 NOTE — H&P (View-Only) (Signed)
GI Office Note    Referring Provider: Yvone Neu Primary Care Physician:  Yvone Neu, MD Primary Gastroenterologist: Elon Alas. Abbey Chatters, DO   Date:  10/22/2022  ID:  Tamara Lynch, DOB 1976/06/29, MRN [id number]   Chief Complaint   Chief Complaint  Patient presents with   Abdominal Pain    Left side abdominal pain for a couple of months.    History of Present Illness  Tamara Lynch is a 46 y.o. female with a history of anxiety, depression, GERD, IBS, pituitary tumor presenting today with complaint of left sided abdominal pain.  Colonoscopy 08/04/2005: -normal per Dr. Donnajean Lopes -random colon biopsies performed. -mild chronic inflammation of colon and duodenum.   EGD 08/06/15: -normal esophagus s/p dilation -small hiatal hernia -mild non erosive gastritis (reactive gastropathy) -gastric polyps s/p biopsy (benign) -carafate as needed, nexium BID, lose weight, eat smaller meals -Normal duodenum s/p biopsy. (Normal)   Last office visit 07/17/20. Doing well on nexium BID. No rectal bleeding. Chronic abdominal pain. Good appetite. Eat raw veggies to have a BM. Advised f/u in 1 year.   Today: Abdominal pain - left upper. Has been going on since July but now she is unable to sleep on that side. Appetite and change in bowel habits. Unable to eat as much food. Used to have IBS-D: lettuce and fatty foods used to bother her. Use to have loose stools and would go 6 times per day and now may go once per day. Pain does not improve with bowel movements. Has a fullness feeling/pressure feeling and finds herself pushing down on her upper abdomen. When trying to sleep pain is 5/10. Sitting down pain is 2/10. Times she has nausea but not on a regular basis. Does report some burning sensation in her upper abdomen after taking cold medicine. Possibly does have some burning with spicy foods. Tries to stay away from milk but does eat cheese. Does admit to a high amount of stress  recently. Denies frequent NSAID use. Does take motrin about once or twice per week associated mostly with menstrual cycle. Earlier this year she was taking more motrin due to knee pain. Has tried other PPIs in the past and this seems to work the best for her. Not taking any extra fiber. Deneis melena or BRBPR.   Since her cholecystectomy she has had yellow stools.   Has bad lumbar disks and knee pain. 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   Does not have a family history of colon cancer. Sister had colon polyps. Denies chest pain, shortens of breath, edema.    Current Outpatient Medications  Medication Sig Dispense Refill   Ascorbic Acid (VITA-C PO) Take by mouth daily.     busPIRone (BUSPAR) 5 MG tablet Take 1 tablet by mouth 3 (three) times daily.     cabergoline (DOSTINEX) 0.5 MG tablet Take 0.25 mg by mouth 2 (two) times a week.     carvedilol (COREG) 25 MG tablet Take 25 mg by mouth 2 (two) times daily with a meal.     Cholecalciferol (VITAMIN D3) 1.25 MG (50000 UT) CAPS Take 1 capsule by mouth once a week.  2   diazepam (VALIUM) 5 MG tablet Take 1 tablet by mouth daily as needed.     esomeprazole (NEXIUM) 40 MG capsule TAKE 1 CAPSULE TWICE DAILY BEFORE MEALS 180 capsule 1   gabapentin (NEURONTIN) 100 MG capsule Take 1 capsule by mouth 2 (two) times daily.  Green Tea, Camellia sinensis, (GREEN TEA EXTRACT PO) Take by mouth daily.     losartan (COZAAR) 100 MG tablet Take 100 mg by mouth daily.  6   MAGNESIUM PO Take by mouth daily.     PRISTIQ 50 MG 24 hr tablet Take 50 mg by mouth daily.      sucralfate (CARAFATE) 1 g tablet Take 1 tablet (1 g total) by mouth 4 (four) times daily -  with meals and at bedtime. 60 tablet 1   venlafaxine XR (EFFEXOR XR) 75 MG 24 hr capsule Take by mouth.     No current facility-administered medications for this visit.    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 Algis Greenhouse: normal colonoscopy, Biopsy showed mild chronic inflammation. No evidence of microscopic colitis.   ESOPHAGEAL DILATION N/A 08/06/2015   Procedure:  ESOPHAGEAL DILATION;  Surgeon: Danie Binder, MD;  Location: [facility];  Service: Endoscopy;  Laterality: N/AAzzie Almas 15/16   ESOPHAGOGASTRODUODENOSCOPY  2011   Dr. Algis Greenhouse. Propofol. hiatal hernia. No Barrett's.   ESOPHAGOGASTRODUODENOSCOPY  2010   Dr. Algis Greenhouse: propofol.hiatal hernia and erosions. ZO:XWRU chronic gastritis/esophagitis   ESOPHAGOGASTRODUODENOSCOPY  2006   Dr. Docia Furl: normal upper endoscopy. Biopsies of the duodenum negative for celiac disease.TTG also normal   ESOPHAGOGASTRODUODENOSCOPY (EGD) WITH PROPOFOL N/A 08/06/2015   SLF: Dysphagia due to possible proximal esophageal web and/or reflux 2. Few gastric polyps 3. Dyspepsia due to mild non-erosive gastriitis and GERD    NASAL SEPTUM SURGERY  2002   POLYPECTOMY N/A 08/06/2015   Procedure: POLYPECTOMY;  Surgeon: Danie Binder, MD;  Location: [facility];  Service: Endoscopy;  Laterality: N/A;  gastric   TONSILLECTOMY  2012    Family History  Problem Relation Age of Onset   Barrett's esophagus Sister    Cervical cancer Mother    Breast cancer Mother    Heart attack Father        age 75, deceased   Colon cancer Neg Hx    Colon polyps Neg Hx     Allergies as of 10/22/2022 - Review Complete 10/22/2022  Allergen Reaction Noted   Biaxin [clarithromycin]  06/19/2015   Lidocaine viscous hcl   08/12/2015   Tequin [gatifloxacin]  06/19/2015   Viberzi [eluxadoline]  08/02/2015    Social History   Socioeconomic History   Marital status: Married    Spouse name: Not on file   Number of children: 2   Years of education: Not on file   Highest education level: Not on file  Occupational History   Occupation: Teacher, adult education  Tobacco Use   Smoking status: Never   Smokeless tobacco: Never   Tobacco comments:    Never smoked  Substance and Sexual Activity   Alcohol use: No    Alcohol/week: 0.0 standard drinks of alcohol   Drug use: No   Sexual activity: Not on  file  Other Topics Concern   Not on file  Social History Narrative   Not on file   Social Determinants of Health   Financial Resource Strain: Not on file  Food Insecurity: Not on file  Transportation Needs: Not on file  Physical Activity: Not on file  Stress: Not on file  Social Connections: Not on file     Review of Systems   Gen: Denies fever, chills, anorexia. Denies fatigue, weakness, weight loss.  CV: Denies chest pain, palpitations, syncope, peripheral edema, and claudication. Resp: Denies dyspnea at rest, cough, wheezing, coughing up blood, and pleurisy. GI: See HPI Derm: Denies rash, itching, dry skin Psych: Denies depression, anxiety, memory loss, confusion. No homicidal or suicidal ideation.  Heme: Denies bruising, bleeding, and enlarged lymph nodes.   Physical Exam   BP (!) 147/88 (BP Location: Right Arm, Patient Position: Sitting, Cuff Size: Normal)   Pulse 72   Temp 97.6 F (36.4 C) (Temporal)   Ht '5\' 2"'$  (1.575 m)   Wt 252 lb 12.8 oz (114.7 kg)   LMP 09/30/2022 (Approximate)   SpO2 96%   BMI 46.24 kg/m   General:   Alert and oriented. No distress noted. Pleasant and cooperative.  Head:  Normocephalic and atraumatic. Eyes:  Conjuctiva clear without scleral icterus. Mouth:  Oral mucosa pink and moist. Good dentition. No lesions. Lungs:  Clear to auscultation  bilaterally. No wheezes, rales, or rhonchi. No distress.  Heart:  S1, S2 present without murmurs appreciated.  Abdomen:  +BS, soft, non-distended. TTP to epigastrium and LUQ extending to left side. No rebound or guarding. No HSM or masses noted. Rectal: deferred Extremities:  Without edema. Neurologic:  Alert and  oriented x4 Psych:  Alert and cooperative. Normal mood and affect.   Assessment  Tamara Lynch is a 46 y.o. female with a history of anxiety, depression, GERD, IBS, pituitary tumor presenting today with complaint of left sided abdominal pain.  GERD/LUQ Abdominal pain: Occurring since July when she was on vacation.  Has noticed decreased appetite since that time.  No sharp pain, typically a fullness feeling/pressure to the area.  If sleeping on that side pain is 5/10, just sitting on normal daily basis pain is 2/10.  Has nausea at times.  Pain not dependent on meals.  Does worsen with bending over and other movements.  Denies any diarrhea.  Has actually had a change in bowel habits to constipation since all this began.  Admits to trying other PPIs in the past, has been maintained on Nexium 40 mg twice daily for control of GERD.  Denies frequent NSAID use, however does admit to Motrin once or twice per week usually with menstrual cycle.  Did have some more frequent use of Motrin earlier this year for knee pain but has decreased significantly.  Will obtain basic labs including lipase to rule assist in ruling out any potential pancreatitis.  No recent abdominal imaging on file.  May consider pending lab workup.  Will proceed with EGD given left upper quadrant pain, reflux, and occasional nausea.  Differentials include gastritis, duodenitis, peptic ulcer disease, pancreatitis (however unlikely).  Advised to continue Nexium 40 mg twice daily and will add Carafate 1 g 4 times daily for 2 weeks given abdominal burning.  Change in bowel habits: Typically would have about 5-6 loose stools per day  since she had her gallbladder removed.  Since her abdominal pain began in July, she has been more constipated.  May only go once per day, sometimes  may skip a day.  Stools are not firm/hard but frequency and consistency are different than prior.  Does note a family history of colon polyps in her sister via colonoscopy at age 43.  No family history of colon cancer.  Denies any unintentional weight loss.  Has had decrease in appetite as stated above.  Will check basic labs including lipase and TSH to assess for any cause of change in bowel habits.  Also will proceed with colonoscopy in the setting of change in bowel habits, patient will be 47 in February therefore she is already close to being due for screening purposes.  PLAN   CBC, Cmp, Lipase, TSH Carafate 1 g QID for 2 weeks.  Nexium 40 mg BID GERD diet/lifestyle modifications. Daily fiber supplement with benefiber  Proceed with upper endoscopy and colonoscopy with propofol by Dr. Abbey Chatters in near future: the risks, benefits, and alternatives have been discussed with the patient in detail. The patient states understanding and desires to proceed. ASA 3 If negative lab workup, may consider abdominal imaging for further evaluation if pain worsens Follow up in 2 months.       Venetia Night, MSN, FNP-BC, AGACNP-BC [facility]

## 2022-10-22 ENCOUNTER — Encounter: Payer: Self-pay | Admitting: Gastroenterology

## 2022-10-22 ENCOUNTER — Encounter: Payer: Self-pay | Admitting: *Deleted

## 2022-10-22 ENCOUNTER — Ambulatory Visit: Payer: Federal, State, Local not specified - PPO | Admitting: Gastroenterology

## 2022-10-22 VITALS — BP 147/88 | HR 72 | Temp 97.6°F | Ht 62.0 in | Wt 252.8 lb

## 2022-10-22 DIAGNOSIS — K219 Gastro-esophageal reflux disease without esophagitis: Secondary | ICD-10-CM | POA: Diagnosis not present

## 2022-10-22 DIAGNOSIS — R194 Change in bowel habit: Secondary | ICD-10-CM

## 2022-10-22 DIAGNOSIS — R1012 Left upper quadrant pain: Secondary | ICD-10-CM

## 2022-10-22 MED ORDER — SUCRALFATE 1 G PO TABS: 1.0000 g | ORAL_TABLET | Freq: Three times a day (TID) | ORAL | 1 refills | Status: DC

## 2022-10-22 MED ORDER — PEG 3350-KCL-NA BICARB-NACL 420 G PO SOLR: 4000.0000 mL | Freq: Once | ORAL | 0 refills | Status: AC

## 2022-10-22 NOTE — Patient Instructions (Addendum)
I am ordering labs for you to have completed at [facility] across the street from [facility] emergency department.  I have sent in Carafate for you to take 1 g 4 times daily for 2 weeks.  You may mix into 1 to 2 ounces of water to make a slurry.  This W with any burning sensation that you experience in your upper abdomen and to treat for any possible peptic ulcer disease or worsening reflux.  We are scheduling you for an upper endoscopy and colonoscopy in the near future with Dr. Abbey Chatters to further evaluate your left upper quadrant abdominal pain as well as your change in bowel habits.  I believe you would benefit from a daily fiber supplementation such as Benefiber to help with some new constipation as well as ensuring you are staying adequately hydrated.  Will see you in 2 months for follow-up, or sooner if needed.  I hope you have a wonderful Christmas and happy new year!  It was a pleasure to see you today. I want to create trusting relationships with patients. If you receive a survey regarding your visit,  I greatly appreciate you taking time to fill this out on paper or through your MyChart. I value your feedback.  Venetia Night, MSN, FNP-BC, AGACNP-BC [facility]

## 2022-10-23 ENCOUNTER — Encounter: Payer: Self-pay | Admitting: *Deleted

## 2022-10-26 NOTE — Patient Instructions (Signed)
Tamara Lynch  10/26/2022     '@[email or web address]'$ @   Your procedure is scheduled on 10/30/2022.  Report to [facility] at 9:30 A.M.  Call this number if you have problems the morning of surgery:  [phone]  If you experience any cold or flu symptoms such as cough, fever, chills, shortness of breath, etc. between now and your scheduled surgery, please notify us at the above number.   Remember:    Please Follow the diet and prep instructions given to you by Dr Ave Filter office.    Take these medicines the morning of surgery with A SIP OF WATER : Buspar, Carvedilol, Zyrtec, Valium, Nexium and Neurontin    Do not wear jewelry, make-up or nail polish.  Do not wear lotions, powders, or perfumes, or deodorant.  Do not shave 48 hours prior to surgery.  Men may shave face and neck.  Do not bring valuables to the hospital.  [facility] is not responsible for any belongings or valuables.  Contacts, dentures or bridgework may not be worn into surgery.  Leave your suitcase in the car.  After surgery it may be brought to your room.  For patients admitted to the hospital, discharge time will be determined by your treatment team.  Patients discharged the day of surgery will not be allowed to drive home.   Name and phone number of your driver:   Family Special instructions:  N/A  Please read over the following fact sheets that you were given. Care and Recovery After Surgery  Colonoscopy, Adult A colonoscopy is a procedure to look at the entire large intestine. This procedure is done using a long, thin, flexible tube that has a camera on the end. You may have a colonoscopy: As a part of normal colorectal screening. If you have certain symptoms, such as: A low number of red blood cells in your blood (anemia). Diarrhea that does not go away. Pain in your abdomen. Blood in your stool. A colonoscopy can help screen for and diagnose medical problems, including: An abnormal growth of  cells or tissue (tumor). Abnormal growths within the lining of your intestine (polyps). Inflammation. Areas of bleeding. Tell your health care provider about: Any allergies you have. All medicines you are taking, including vitamins, herbs, eye drops, creams, and over-the-counter medicines. Any problems you or family members have had with anesthetic medicines. Any bleeding problems you have. Any surgeries you have had. Any medical conditions you have. Any problems you have had with having bowel movements. Whether you are pregnant or may be pregnant. What are the risks? Generally, this is a safe procedure. However, problems may occur, including: Bleeding. Damage to your intestine. Allergic reactions to medicines given during the procedure. Infection. This is rare. What happens before the procedure? Eating and drinking restrictions Follow instructions from your health care provider about eating or drinking restrictions, which may include: A few days before the procedure: Follow a low-fiber diet. Avoid nuts, seeds, dried fruit, raw fruits, and vegetables. 1-3 days before the procedure: Eat only gelatin dessert or ice pops. Drink only clear liquids, such as water, clear juice, clear broth or bouillon, black coffee or tea, or clear soft drinks or sports drinks. Avoid liquids that contain red or purple dye. The day of the procedure: Do not eat solid foods. You may continue to drink clear liquids until up to 2 hours before the procedure. Do not eat or drink anything starting 2 hours before the procedure, or within the time period that  your health care provider recommends. Bowel prep If you were prescribed a bowel prep to take by mouth (orally) to clean out your colon: Take it as told by your health care provider. Starting the day before your procedure, you will need to drink a large amount of liquid medicine. The liquid will cause you to have many bowel movements of loose stool until your  stool becomes almost clear or light green. If your skin or the opening between the buttocks (anus) gets irritated from diarrhea, you may relieve the irritation using: Wipes with medicine in them, such as adult wet wipes with aloe and vitamin E. A product to soothe skin, such as petroleum jelly. If you vomit while drinking the bowel prep: Take a break for up to 60 minutes. Begin the bowel prep again. Call your health care provider if you keep vomiting or you cannot take the bowel prep without vomiting. To clean out your colon, you may also be given: Laxative medicines. These help you have a bowel movement. Instructions for enema use. An enema is liquid medicine injected into your rectum. Medicines Ask your health care provider about: Changing or stopping your regular medicines or supplements. This is especially important if you are taking iron supplements, diabetes medicines, or blood thinners. Taking medicines such as aspirin and ibuprofen. These medicines can thin your blood. Do not take these medicines unless your health care provider tells you to take them. Taking over-the-counter medicines, vitamins, herbs, and supplements. General instructions Ask your health care provider what steps will be taken to help prevent infection. These may include washing skin with a germ-killing soap. If you will be going home right after the procedure, plan to have a responsible adult: Take you home from the hospital or clinic. You will not be allowed to drive. Care for you for the time you are told. What happens during the procedure?  An IV will be inserted into one of your veins. You will be given a medicine to make you fall asleep (general anesthetic). You will lie on your side with your knees bent. A lubricant will be put on the tube. Then the tube will be: Inserted into your anus. Gently eased through all parts of your large intestine. Air will be sent into your colon to keep it open. This may  cause some pressure or cramping. Images will be taken with the camera and will appear on a screen. A small tissue sample may be removed to be looked at under a microscope (biopsy). The tissue may be sent to a lab for testing if any signs of problems are found. If small polyps are found, they may be removed and checked for cancer cells. When the procedure is finished, the tube will be removed. The procedure may vary among health care providers and hospitals. What happens after the procedure? Your blood pressure, heart rate, breathing rate, and blood oxygen level will be monitored until you leave the hospital or clinic. You may have a small amount of blood in your stool. You may pass gas and have mild cramping or bloating in your abdomen. This is caused by the air that was used to open your colon during the exam. If you were given a sedative during the procedure, it can affect you for several hours. Do not drive or operate machinery until your health care provider says that it is safe. It is up to you to get the results of your procedure. Ask your health care provider, or the department that  is doing the procedure, when your results will be ready. Summary A colonoscopy is a procedure to look at the entire large intestine. Follow instructions from your health care provider about eating and drinking before the procedure. If you were prescribed an oral bowel prep to clean out your colon, take it as told by your health care provider. During the colonoscopy, a flexible tube with a camera on its end is inserted into the anus and then passed into all parts of the large intestine. This information is not intended to replace advice given to you by your health care provider. Make sure you discuss any questions you have with your health care provider. Document Revised: 10/20/2021 Document Reviewed: 06/18/2021 Elsevier Patient Education  [place] Endoscopy, Adult Upper endoscopy is a  procedure to look inside the upper GI (gastrointestinal) tract. The upper GI tract is made up of: The esophagus. This is the part of the body that moves food from your mouth to your stomach. The stomach. The duodenum. This is the first part of your small intestine. This procedure is also called esophagogastroduodenoscopy (EGD) or gastroscopy. In this procedure, your health care provider passes a thin, flexible tube (endoscope) through your mouth and down your esophagus into your stomach and into your duodenum. A small camera is attached to the end of the tube. Images from the camera appear on a monitor in the exam room. During this procedure, your health care provider may also remove a small piece of tissue to be sent to a lab and examined under a microscope (biopsy). Your health care provider may do an upper endoscopy to diagnose cancers of the upper GI tract. You may also have this procedure to find the cause of other conditions, such as: Stomach pain. Heartburn. Pain or problems when swallowing. Nausea and vomiting. Stomach bleeding. Stomach ulcers. Tell a health care provider about: Any allergies you have. All medicines you are taking, including vitamins, herbs, eye drops, creams, and over-the-counter medicines. Any problems you or family members have had with anesthetic medicines. Any bleeding problems you have. Any surgeries you have had. Any medical conditions you have. Whether you are pregnant or may be pregnant. What are the risks? Your healthcare provider will talk with you about risks. These may include: Infection. Bleeding. Allergic reactions to medicines. A tear or hole (perforation) in the esophagus, stomach, or duodenum. What happens before the procedure? When to stop eating and drinking Follow instructions from your health care provider about what you may eat and drink. These may include: 8 hours before your procedure Stop eating most foods. Do not eat meat, fried foods,  or fatty foods. Eat only light foods, such as toast or crackers. All liquids are okay except energy drinks and alcohol. 6 hours before your procedure Stop eating. Drink only clear liquids, such as water, clear fruit juice, black coffee, plain tea, and sports drinks. Do not drink energy drinks or alcohol. 2 hours before your procedure Stop drinking all liquids. You may be allowed to take medicines with small sips of water. If you do not follow your health care provider's instructions, your procedure may be delayed or canceled. Medicines Ask your health care provider about: Changing or stopping your regular medicines. This is especially important if you are taking diabetes medicines or blood thinners. Taking medicines such as aspirin and ibuprofen. These medicines can thin your blood. Do not take these medicines unless your health care provider tells you to take them. Taking over-the-counter medicines,  vitamins, herbs, and supplements. General instructions If you will be going home right after the procedure, plan to have a responsible adult: Take you home from the hospital or clinic. You will not be allowed to drive. Care for you for the time you are told. What happens during the procedure?  An IV will be inserted into one of your veins. You may be given one or more of the following: A medicine to help you relax (sedative). A medicine to numb the throat (local anesthetic). You will lie on your left side on an exam table. Your health care provider will pass the endoscope through your mouth and down your esophagus. Your health care provider will use the scope to check the inside of your esophagus, stomach, and duodenum. Biopsies may be taken. The endoscope will be removed. The procedure may vary among health care providers and hospitals. What happens after the procedure? Your blood pressure, heart rate, breathing rate, and blood oxygen level will be monitored until you leave the hospital  or clinic. When your throat is no longer numb, you may be given some fluids to drink. If you were given a sedative during the procedure, it can affect you for several hours. Do not drive or operate machinery until your health care provider says that it is safe. It is up to you to get the results of your procedure. Ask your health care provider, or the department that is doing the procedure, when your results will be ready. Contact a health care provider if you: Have a sore throat that lasts longer than 1 day. Have a fever. Get help right away if you: Vomit blood or your vomit looks like coffee grounds. Have bloody, black, or tarry stools. Have a very bad sore throat or you cannot swallow. Have difficulty breathing or very bad pain in your chest or abdomen. These symptoms may be an emergency. Get help right away. Call 911. Do not wait to see if the symptoms will go away. Do not drive yourself to the hospital. Summary Upper endoscopy is a procedure to look inside the upper GI tract. During the procedure, an IV will be inserted into one of your veins. You may be given a medicine to help you relax. The endoscope will be passed through your mouth and down your esophagus. Follow instructions from your health care provider about what you can eat and drink. This information is not intended to replace advice given to you by your health care provider. Make sure you discuss any questions you have with your health care provider. Document Revised: 02/04/2022 Document Reviewed: 02/04/2022 Elsevier Patient Education  [place] Anesthesia refers to the techniques, procedures, and medicines that help a person stay safe and comfortable during surgery. Monitored anesthesia care, or sedation, is one type of anesthesia. You may have sedation if you do not need to be asleep for your procedure. Procedures that use sedation may include: Surgery to remove cataracts from your  eyes. A dental procedure. A biopsy. This is when a tissue sample is removed and looked at under a microscope. You will be watched closely during your procedure. Your level of sedation or type of anesthesia may be changed to fit your needs. Tell a health care provider about: Any allergies you have. All medicines you are taking, including vitamins, herbs, eye drops, creams, and over-the-counter medicines. Any problems you or family members have had with anesthesia. Any bleeding problems you have. Any surgeries you have had. Any  medical conditions or illnesses you have. This includes sleep apnea, cough, fever, or the flu. Whether you are pregnant or may be pregnant. Whether you use cigarettes, alcohol, or drugs. Any use of steroids, whether by mouth or as a cream. What are the risks? Your health care provider will talk with you about risks. These may include: Getting too much medicine (oversedation). Nausea. Allergic reactions to medicines. Trouble breathing. If this happens, a breathing tube may be used to help you breathe. It will be removed when you are awake and breathing on your own. Heart trouble. Lung trouble. Confusion that gets better with time (emergence delirium). What happens before the procedure? When to stop eating and drinking Follow instructions from your health care provider about what you may eat and drink. These may include: 8 hours before your procedure Stop eating most foods. Do not eat meat, fried foods, or fatty foods. Eat only light foods, such as toast or crackers. All liquids are okay except energy drinks and alcohol. 6 hours before your procedure Stop eating. Drink only clear liquids, such as water, clear fruit juice, black coffee, plain tea, and sports drinks. Do not drink energy drinks or alcohol. 2 hours before your procedure Stop drinking all liquids. You may be allowed to take medicines with small sips of water. If you do not follow your health care  provider's instructions, your procedure may be delayed or canceled. Medicines Ask your health care provider about: Changing or stopping your regular medicines. These include any diabetes medicines or blood thinners you take. Taking medicines such as aspirin and ibuprofen. These medicines can thin your blood. Do not take them unless your health care provider tells you to. Taking over-the-counter medicines, vitamins, herbs, and supplements. Testing You may have an exam or testing. You may have a blood or urine sample taken. General instructions Do not use any products that contain nicotine or tobacco for at least 4 weeks before the procedure. These products include cigarettes, chewing tobacco, and vaping devices, such as e-cigarettes. If you need help quitting, ask your health care provider. If you will be going home right after the procedure, plan to have a responsible adult: Take you home from the hospital or clinic. You will not be allowed to drive. Care for you for the time you are told. What happens during the procedure?  Your blood pressure, heart rate, breathing, level of pain, and blood oxygen level will be monitored. An IV will be inserted into one of your veins. You may be given: A sedative. This helps you relax. Anesthesia. This will: Numb certain areas of your body. Make you fall asleep for surgery. You will be given medicines as needed to keep you comfortable. The more medicine you are given, the deeper your level of sedation will be. Your level of sedation may be changed to fit your needs. There are three levels of sedation: Mild sedation. At this level, you may feel awake and relaxed. You will be able to follow directions. Moderate sedation. At this level, you will be sleepy. You may not remember the procedure. Deep sedation. At this level, you will be asleep. You will not remember the procedure. How you get the medicines will depend on your age and the procedure. They may be  given as: A pill. This may be taken by mouth (orally) or inserted into the rectum. An injection. This may be into a vein or muscle. A spray through the nose. After your procedure is over, the medicine will be  stopped. The procedure may vary among health care providers and hospitals. What happens after the procedure? Your blood pressure, heart rate, breathing rate, and blood oxygen level will be monitored until you leave the hospital or clinic. You may feel sleepy, clumsy, or nauseous. You may not remember what happened during or after the procedure. Sedation can affect you for several hours. Do not drive or use machinery until your health care provider says that it is safe. This information is not intended to replace advice given to you by your health care provider. Make sure you discuss any questions you have with your health care provider. Document Revised: 03/23/2022 Document Reviewed: 03/23/2022 Elsevier Patient Education  [place].

## 2022-10-27 ENCOUNTER — Encounter (HOSPITAL_COMMUNITY): Payer: Self-pay

## 2022-10-27 ENCOUNTER — Encounter (HOSPITAL_COMMUNITY)
Admission: RE | Admit: 2022-10-27 | Discharge: 2022-10-27 | Disposition: A | Discharge status: Home / Self Care | Payer: Federal, State, Local not specified - PPO | Source: Ambulatory Visit | Attending: Internal Medicine | Admitting: Internal Medicine

## 2022-10-27 VITALS — BP 124/63 | HR 88 | Temp 97.5°F | Resp 18 | Ht 62.0 in | Wt 252.8 lb

## 2022-10-27 DIAGNOSIS — Z01818 Encounter for other preprocedural examination: Secondary | ICD-10-CM | POA: Insufficient documentation

## 2022-10-27 DIAGNOSIS — K589 Irritable bowel syndrome without diarrhea: Secondary | ICD-10-CM | POA: Diagnosis not present

## 2022-10-27 DIAGNOSIS — R1012 Left upper quadrant pain: Secondary | ICD-10-CM | POA: Diagnosis present

## 2022-10-27 DIAGNOSIS — I10 Essential (primary) hypertension: Secondary | ICD-10-CM | POA: Insufficient documentation

## 2022-10-27 DIAGNOSIS — K222 Esophageal obstruction: Secondary | ICD-10-CM | POA: Diagnosis not present

## 2022-10-27 DIAGNOSIS — K449 Diaphragmatic hernia without obstruction or gangrene: Secondary | ICD-10-CM | POA: Diagnosis not present

## 2022-10-27 DIAGNOSIS — K2970 Gastritis, unspecified, without bleeding: Secondary | ICD-10-CM | POA: Diagnosis not present

## 2022-10-27 DIAGNOSIS — K648 Other hemorrhoids: Secondary | ICD-10-CM | POA: Diagnosis not present

## 2022-10-27 DIAGNOSIS — R12 Heartburn: Secondary | ICD-10-CM | POA: Diagnosis not present

## 2022-10-27 DIAGNOSIS — K5900 Constipation, unspecified: Secondary | ICD-10-CM | POA: Diagnosis not present

## 2022-10-27 DIAGNOSIS — Z6841 Body Mass Index (BMI) 40.0 and over, adult: Secondary | ICD-10-CM | POA: Diagnosis not present

## 2022-10-27 DIAGNOSIS — F32A Depression, unspecified: Secondary | ICD-10-CM | POA: Diagnosis not present

## 2022-10-27 DIAGNOSIS — Z83719 Family history of colon polyps, unspecified: Secondary | ICD-10-CM | POA: Diagnosis not present

## 2022-10-27 DIAGNOSIS — I4711 Inappropriate sinus tachycardia, so stated: Secondary | ICD-10-CM | POA: Diagnosis not present

## 2022-10-27 DIAGNOSIS — F419 Anxiety disorder, unspecified: Secondary | ICD-10-CM | POA: Diagnosis not present

## 2022-10-27 DIAGNOSIS — G4730 Sleep apnea, unspecified: Secondary | ICD-10-CM | POA: Diagnosis not present

## 2022-10-27 DIAGNOSIS — K219 Gastro-esophageal reflux disease without esophagitis: Secondary | ICD-10-CM | POA: Diagnosis not present

## 2022-10-27 HISTORY — DX: Cardiac arrhythmia, unspecified: I49.9

## 2022-10-27 HISTORY — DX: Essential (primary) hypertension: I10

## 2022-10-27 HISTORY — DX: Inappropriate sinus tachycardia, so stated: I47.11

## 2022-10-27 LAB — POCT PREGNANCY, URINE: Preg Test, Ur: NEGATIVE

## 2022-10-28 LAB — CBC
HCT: 36.6 % (ref 35.0–45.0)
Hemoglobin: 12.7 g/dL (ref 11.7–15.5)
MCH: 30.6 pg (ref 27.0–33.0)
MCHC: 34.7 g/dL (ref 32.0–36.0)
MCV: 88.2 fL (ref 80.0–100.0)
MPV: 10.6 fL (ref 7.5–12.5)
Platelets: 228 10*3/uL (ref 140–400)
RBC: 4.15 10*6/uL (ref 3.80–5.10)
RDW: 12.6 % (ref 11.0–15.0)
WBC: 6.2 10*3/uL (ref 3.8–10.8)

## 2022-10-28 LAB — COMPREHENSIVE METABOLIC PANEL
AG Ratio: 1.6 (calc) (ref 1.0–2.5)
ALT: 19 U/L (ref 6–29)
AST: 13 U/L (ref 10–30)
Albumin: 4.3 g/dL (ref 3.6–5.1)
Alkaline phosphatase (APISO): 57 U/L (ref 31–125)
BUN: 11 mg/dL (ref 7–25)
CO2: 26 mmol/L (ref 20–32)
Calcium: 9.4 mg/dL (ref 8.6–10.2)
Chloride: 105 mmol/L (ref 98–110)
Creat: 0.64 mg/dL (ref 0.50–0.99)
Globulin: 2.7 g/dL (calc) (ref 1.9–3.7)
Glucose, Bld: 96 mg/dL (ref 65–99)
Potassium: 4 mmol/L (ref 3.5–5.3)
Sodium: 139 mmol/L (ref 135–146)
Total Bilirubin: 0.9 mg/dL (ref 0.2–1.2)
Total Protein: 7 g/dL (ref 6.1–8.1)

## 2022-10-28 LAB — TSH+FREE T4: TSH W/REFLEX TO FT4: 3.29 mIU/L

## 2022-10-28 LAB — LIPASE: Lipase: 9 U/L (ref 7–60)

## 2022-10-30 ENCOUNTER — Encounter (HOSPITAL_COMMUNITY)
Admission: RE | Disposition: A | Discharge status: Home / Self Care | Payer: Self-pay | Source: Home / Self Care | Attending: Internal Medicine

## 2022-10-30 ENCOUNTER — Encounter (HOSPITAL_COMMUNITY): Payer: Self-pay

## 2022-10-30 ENCOUNTER — Ambulatory Visit (HOSPITAL_COMMUNITY): Payer: Federal, State, Local not specified - PPO | Admitting: Anesthesiology

## 2022-10-30 ENCOUNTER — Ambulatory Visit (HOSPITAL_COMMUNITY)
Admission: RE | Admit: 2022-10-30 | Discharge: 2022-10-30 | Disposition: A | Discharge status: Home / Self Care | Payer: Federal, State, Local not specified - PPO | Attending: Internal Medicine | Admitting: Internal Medicine

## 2022-10-30 DIAGNOSIS — K2970 Gastritis, unspecified, without bleeding: Secondary | ICD-10-CM | POA: Diagnosis not present

## 2022-10-30 DIAGNOSIS — K449 Diaphragmatic hernia without obstruction or gangrene: Secondary | ICD-10-CM | POA: Insufficient documentation

## 2022-10-30 DIAGNOSIS — R1012 Left upper quadrant pain: Secondary | ICD-10-CM | POA: Diagnosis not present

## 2022-10-30 DIAGNOSIS — I4711 Inappropriate sinus tachycardia, so stated: Secondary | ICD-10-CM | POA: Insufficient documentation

## 2022-10-30 DIAGNOSIS — R194 Change in bowel habit: Secondary | ICD-10-CM

## 2022-10-30 DIAGNOSIS — K219 Gastro-esophageal reflux disease without esophagitis: Secondary | ICD-10-CM | POA: Insufficient documentation

## 2022-10-30 DIAGNOSIS — F32A Depression, unspecified: Secondary | ICD-10-CM | POA: Insufficient documentation

## 2022-10-30 DIAGNOSIS — K589 Irritable bowel syndrome without diarrhea: Secondary | ICD-10-CM | POA: Insufficient documentation

## 2022-10-30 DIAGNOSIS — Z6841 Body Mass Index (BMI) 40.0 and over, adult: Secondary | ICD-10-CM | POA: Insufficient documentation

## 2022-10-30 DIAGNOSIS — K5900 Constipation, unspecified: Secondary | ICD-10-CM | POA: Insufficient documentation

## 2022-10-30 DIAGNOSIS — R12 Heartburn: Secondary | ICD-10-CM | POA: Insufficient documentation

## 2022-10-30 DIAGNOSIS — Z83719 Family history of colon polyps, unspecified: Secondary | ICD-10-CM | POA: Insufficient documentation

## 2022-10-30 DIAGNOSIS — K648 Other hemorrhoids: Secondary | ICD-10-CM | POA: Insufficient documentation

## 2022-10-30 DIAGNOSIS — I10 Essential (primary) hypertension: Secondary | ICD-10-CM | POA: Insufficient documentation

## 2022-10-30 DIAGNOSIS — G4730 Sleep apnea, unspecified: Secondary | ICD-10-CM | POA: Insufficient documentation

## 2022-10-30 DIAGNOSIS — K222 Esophageal obstruction: Secondary | ICD-10-CM | POA: Insufficient documentation

## 2022-10-30 DIAGNOSIS — F419 Anxiety disorder, unspecified: Secondary | ICD-10-CM | POA: Insufficient documentation

## 2022-10-30 HISTORY — PX: ESOPHAGOGASTRODUODENOSCOPY (EGD) WITH PROPOFOL: SHX5813

## 2022-10-30 HISTORY — PX: COLONOSCOPY WITH PROPOFOL: SHX5780

## 2022-10-30 SURGERY — COLONOSCOPY WITH PROPOFOL
Anesthesia: General

## 2022-10-30 MED ORDER — LACTATED RINGERS IV SOLN: INTRAVENOUS | Status: DC | PRN

## 2022-10-30 MED ORDER — PROPOFOL 500 MG/50ML IV EMUL
INTRAVENOUS | Status: DC | PRN
  Administered 2022-10-30: 150 ug/kg/min via INTRAVENOUS

## 2022-10-30 MED ORDER — STERILE WATER FOR IRRIGATION IR SOLN
Status: DC | PRN
  Administered 2022-10-30: 60 mL

## 2022-10-30 MED ORDER — PROPOFOL 10 MG/ML IV BOLUS
INTRAVENOUS | Status: DC | PRN
  Administered 2022-10-30: 40 mg via INTRAVENOUS
  Administered 2022-10-30: 20 mg via INTRAVENOUS
  Administered 2022-10-30: 40 mg via INTRAVENOUS
  Administered 2022-10-30 (×4): 20 mg via INTRAVENOUS

## 2022-10-30 NOTE — Discharge Instructions (Signed)
EGD Discharge instructions Please read the instructions outlined below and refer to this sheet in the next few weeks. These discharge instructions provide you with general information on caring for yourself after you leave the hospital. Your doctor may also give you specific instructions. While your treatment has been planned according to the most current medical practices available, unavoidable complications occasionally occur. If you have any problems or questions after discharge, please call your doctor. ACTIVITY You may resume your regular activity but move at a slower pace for the next 24 hours.  Take frequent rest periods for the next 24 hours.  Walking will help expel (get rid of) the air and reduce the bloated feeling in your abdomen.  No driving for 24 hours (because of the anesthesia (medicine) used during the test).  You may shower.  Do not sign any important legal documents or operate any machinery for 24 hours (because of the anesthesia used during the test).  NUTRITION Drink plenty of fluids.  You may resume your normal diet.  Begin with a light meal and progress to your normal diet.  Avoid alcoholic beverages for 24 hours or as instructed by your caregiver.  MEDICATIONS You may resume your normal medications unless your caregiver tells you otherwise.  WHAT YOU CAN EXPECT TODAY You may experience abdominal discomfort such as a feeling of fullness or "gas" pains.  FOLLOW-UP Your doctor will discuss the results of your test with you.  SEEK IMMEDIATE MEDICAL ATTENTION IF ANY OF THE FOLLOWING OCCUR: Excessive nausea (feeling sick to your stomach) and/or vomiting.  Severe abdominal pain and distention (swelling).  Trouble swallowing.  Temperature over 101 F (37.8 C).  Rectal bleeding or vomiting of blood.    Colonoscopy Discharge Instructions  Read the instructions outlined below and refer to this sheet in the next few weeks. These discharge instructions provide you with  general information on caring for yourself after you leave the hospital. Your doctor may also give you specific instructions. While your treatment has been planned according to the most current medical practices available, unavoidable complications occasionally occur.   ACTIVITY You may resume your regular activity, but move at a slower pace for the next 24 hours.  Take frequent rest periods for the next 24 hours.  Walking will help get rid of the air and reduce the bloated feeling in your belly (abdomen).  No driving for 24 hours (because of the medicine (anesthesia) used during the test).   Do not sign any important legal documents or operate any machinery for 24 hours (because of the anesthesia used during the test).  NUTRITION Drink plenty of fluids.  You may resume your normal diet as instructed by your doctor.  Begin with a light meal and progress to your normal diet. Heavy or fried foods are harder to digest and may make you feel sick to your stomach (nauseated).  Avoid alcoholic beverages for 24 hours or as instructed.  MEDICATIONS You may resume your normal medications unless your doctor tells you otherwise.  WHAT YOU CAN EXPECT TODAY Some feelings of bloating in the abdomen.  Passage of more gas than usual.  Spotting of blood in your stool or on the toilet paper.  IF YOU HAD POLYPS REMOVED DURING THE COLONOSCOPY: No aspirin products for 7 days or as instructed.  No alcohol for 7 days or as instructed.  Eat a soft diet for the next 24 hours.  FINDING OUT THE RESULTS OF YOUR TEST Not all test results are available  during your visit. If your test results are not back during the visit, make an appointment with your caregiver to find out the results. Do not assume everything is normal if you have not heard from your caregiver or the medical facility. It is important for you to follow up on all of your test results.  SEEK IMMEDIATE MEDICAL ATTENTION IF: You have more than a spotting of  blood in your stool.  Your belly is swollen (abdominal distention).  You are nauseated or vomiting.  You have a temperature over 101.  You have abdominal pain or discomfort that is severe or gets worse throughout the day.   Your EGD revealed mild amount inflammation in your stomach.  I took biopsies of this to rule out infection with a bacteria called H. pylori.  Await pathology results, my office will contact you.  You also have a small hiatal hernia.  Esophagus and small bowel otherwise appeared normal.  Continue on Nexium.  Overall your colon looked very healthy.  I did not find any polyps or evidence of colon cancer.  No active inflammation indicative of underlying inflammatory bowel disease.  Repeat colonoscopy 10 years for screening purposes.  Follow-up with GI in 2 to 3 months.  I hope you have a great rest of your week!  Elon Alas. Abbey Chatters, D.O. Gastroenterology and Hepatology [facility]

## 2022-10-30 NOTE — Anesthesia Preprocedure Evaluation (Signed)
Anesthesia Evaluation  Patient identified by MRN, date of birth, ID band Patient awake    Reviewed: Allergy & Precautions, H&P , NPO status , Patient's Chart, lab work & pertinent test results, reviewed documented beta blocker date and time   History of Anesthesia Complications (+) PONV and history of anesthetic complications  Airway Mallampati: II  TM Distance: >3 FB Neck ROM: full    Dental no notable dental hx.    Pulmonary sleep apnea    Pulmonary exam normal breath sounds clear to auscultation       Cardiovascular Exercise Tolerance: Good hypertension, negative cardio ROS  Rhythm:regular Rate:Normal     Neuro/Psych  PSYCHIATRIC DISORDERS Anxiety Depression    negative neurological ROS     GI/Hepatic Neg liver ROS,GERD  Medicated,,  Endo/Other    Morbid obesity  Renal/GU negative Renal ROS  negative genitourinary   Musculoskeletal   Abdominal   Peds  Hematology negative hematology ROS (+)   Anesthesia Other Findings   Reproductive/Obstetrics negative OB ROS                             Anesthesia Physical Anesthesia Plan  ASA: 3  Anesthesia Plan: General   Post-op Pain Management:    Induction:   PONV Risk Score and Plan: Propofol infusion  Airway Management Planned:   Additional Equipment:   Intra-op Plan:   Post-operative Plan:   Informed Consent: I have reviewed the patients History and Physical, chart, labs and discussed the procedure including the risks, benefits and alternatives for the proposed anesthesia with the patient or authorized representative who has indicated his/her understanding and acceptance.     Dental Advisory Given  Plan Discussed with: CRNA  Anesthesia Plan Comments:        Anesthesia Quick Evaluation

## 2022-10-30 NOTE — Op Note (Signed)
[facility] Patient Name: Tamara Lynch Procedure Date: 10/30/2022 1:30 PM MRN: [id number] Date of Birth: 31-Aug-1976 Attending MD: Elon Alas. Abbey Chatters , [place], [id number] CSN: [id number] Age: 46 Admit Type: Outpatient Procedure:                Colonoscopy Indications:              Abdominal pain in the left upper quadrant, Change                            in bowel habits Providers:                Elon Alas. Abbey Chatters, DO, Lurline Del, RN, Everardo Pacific Referring MD:              Medicines:                See the Anesthesia note for documentation of the                            administered medications Complications:            No immediate complications. Estimated Blood Loss:     Estimated blood loss: none. Procedure:                Pre-Anesthesia Assessment:                           - The anesthesia plan was to use monitored                            anesthesia care (MAC).                           After obtaining informed consent, the colonoscope                            was passed under direct vision. Throughout the                            procedure, the patient's blood pressure, pulse, and                            oxygen saturations were monitored continuously. The                            [id number]-HQ190L ([id number]) scope was introduced through                            the anus and advanced to the the cecum, identified                            by appendiceal orifice and ileocecal valve. The                            colonoscopy was performed without difficulty. The  patient tolerated the procedure well. The quality                            of the bowel preparation was evaluated using the                            BBPS [facility] Bowel Preparation Scale) with scores                            of: Right Colon = 3, Transverse Colon = 3 and Left                            Colon = 3 (entire mucosa seen well with no  residual                            staining, small fragments of stool or opaque                            liquid). The total BBPS score equals 9. Scope In: 1:33:29 PM Scope Out: 1:44:39 PM Scope Withdrawal Time: 0 hours 7 minutes 57 seconds  Total Procedure Duration: 0 hours 11 minutes 10 seconds  Findings:      The perianal and digital rectal examinations were normal.      Non-bleeding internal hemorrhoids were found during endoscopy.      The exam was otherwise without abnormality. Impression:               - Non-bleeding internal hemorrhoids.                           - The examination was otherwise normal.                           - No specimens collected. Moderate Sedation:      Per Anesthesia Care Recommendation:           - Patient has a contact number available for                            emergencies. The signs and symptoms of potential                            delayed complications were discussed with the                            patient. Return to normal activities tomorrow.                            Written discharge instructions were provided to the                            patient.                           - Resume previous diet.                           -  Continue present medications.                           - Repeat colonoscopy in 10 years for screening                            purposes.                           - Return to GI clinic in 3 months. Procedure Code(s):        --- Professional ---                           [phone], Colonoscopy, flexible; diagnostic, including                            collection of specimen(s) by brushing or washing,                            when performed (separate procedure) Diagnosis Code(s):        --- Professional ---                           K64.8, Other hemorrhoids                           R10.12, Left upper quadrant pain                           R19.4, Change in bowel habit CPT copyright 2022 American  Medical Association. All rights reserved. The codes documented in this report are preliminary and upon coder review may  be revised to meet current compliance requirements. Elon Alas. Abbey Chatters, DO [place] Abbey Chatters, DO 10/30/2022 1:48:15 PM This report has been signed electronically. Number of Addenda: 0

## 2022-10-30 NOTE — Op Note (Signed)
[facility] Patient Name: Tamara Lynch Procedure Date: 10/30/2022 1:20 PM MRN: [id number] Date of Birth: [birth year] Attending MD: Elon Alas. Abbey Chatters , [place], [id number] CSN: [id number] Age: 46 Admit Type: Outpatient Procedure:                Upper GI endoscopy Indications:              Abdominal pain in the left upper quadrant, Heartburn Providers:                Elon Alas. Abbey Chatters, DO, Lurline Del, RN, Everardo Pacific Referring MD:              Medicines:                See the Anesthesia note for documentation of the                            administered medications Complications:            No immediate complications. Estimated Blood Loss:     Estimated blood loss was minimal. Procedure:                Pre-Anesthesia Assessment:                           - The anesthesia plan was to use monitored                            anesthesia care (MAC).                           After obtaining informed consent, the endoscope was                            passed under direct vision. Throughout the                            procedure, the patient's blood pressure, pulse, and                            oxygen saturations were monitored continuously. The                            [id number]-[id number] ([id number]) scope was introduced through the                            mouth, and advanced to the second part of duodenum.                            The upper GI endoscopy was accomplished without                            difficulty. The patient tolerated the procedure  well. Scope In: 1:26:02 PM Scope Out: 1:28:46 PM Total Procedure Duration: 0 hours 2 minutes 44 seconds  Findings:      A small hiatal hernia was present.      The Z-line was regular and was found 35 cm from the incisors.      A non-obstructing Schatzki ring was found in the distal esophagus.      Patchy mild inflammation characterized by erythema was found in the        stomach. Biopsies were taken with a cold forceps for Helicobacter pylori       testing.      The duodenal bulb, first portion of the duodenum and second portion of       the duodenum were normal. Impression:               - Small hiatal hernia.                           - Z-line regular, 35 cm from the incisors.                           - Non-obstructing Schatzki ring.                           - Gastritis. Biopsied.                           - Normal duodenal bulb, first portion of the                            duodenum and second portion of the duodenum. Moderate Sedation:      Per Anesthesia Care Recommendation:           - Patient has a contact number available for                            emergencies. The signs and symptoms of potential                            delayed complications were discussed with the                            patient. Return to normal activities tomorrow.                            Written discharge instructions were provided to the                            patient.                           - Resume previous diet.                           - Continue present medications.                           - Await pathology results.                           -  Use a proton pump inhibitor PO BID.                           - Return to GI clinic in 3 months. Procedure Code(s):        --- Professional ---                           [phone], Esophagogastroduodenoscopy, flexible,                            transoral; with biopsy, single or multiple Diagnosis Code(s):        --- Professional ---                           K44.9, Diaphragmatic hernia without obstruction or                            gangrene                           K22.2, Esophageal obstruction                           K29.70, Gastritis, unspecified, without bleeding                           R10.12, Left upper quadrant pain                           R12, Heartburn CPT copyright 2022 American  Medical Association. All rights reserved. The codes documented in this report are preliminary and upon coder review may  be revised to meet current compliance requirements. Elon Alas. Abbey Chatters, DO [place] Abbey Chatters, DO 10/30/2022 1:30:57 PM This report has been signed electronically. Number of Addenda: 0

## 2022-10-30 NOTE — Transfer of Care (Signed)
Immediate Anesthesia Transfer of Care Note  Patient: Tamara Lynch  Procedure(s) Performed: COLONOSCOPY WITH PROPOFOL ESOPHAGOGASTRODUODENOSCOPY (EGD) WITH PROPOFOL  Patient Location: PACU  Anesthesia Type:MAC  Level of Consciousness: awake, alert , and oriented  Airway & Oxygen Therapy: Patient Spontanous Breathing and Patient connected to face mask oxygen  Post-op Assessment: Report given to RN and Post -op Vital signs reviewed and stable  Post vital signs: Reviewed and stable  Last Vitals:  Vitals Value Taken Time  BP 122/78   Temp    Pulse 74   Resp 14   SpO2 98%     Last Pain:  Vitals:   10/30/22 1017  TempSrc: Oral  PainSc: 5       Patients Stated Pain Goal: 8 (20/94/70 9628)  Complications: No notable events documented.

## 2022-10-30 NOTE — Interval H&P Note (Signed)
History and Physical Interval Note:  10/30/2022 12:52 PM  Tamara Lynch  has presented today for surgery, with the diagnosis of LUQ pain,GERD,change in bowel habits.  The various methods of treatment have been discussed with the patient and family. After consideration of risks, benefits and other options for treatment, the patient has consented to  Procedure(s) with comments: COLONOSCOPY WITH PROPOFOL (N/A) - 2:15 pm ESOPHAGOGASTRODUODENOSCOPY (EGD) WITH PROPOFOL (N/A) as a surgical intervention.  The patient's history has been reviewed, patient examined, no change in status, stable for surgery.  I have reviewed the patient's chart and labs.  Questions were answered to the patient's satisfaction.     Eloise Harman

## 2022-10-30 NOTE — Anesthesia Postprocedure Evaluation (Signed)
Anesthesia Post Note  Patient: Tamara Lynch  Procedure(s) Performed: COLONOSCOPY WITH PROPOFOL ESOPHAGOGASTRODUODENOSCOPY (EGD) WITH PROPOFOL  Patient location during evaluation: Phase II Anesthesia Type: General Level of consciousness: awake Pain management: pain level controlled Vital Signs Assessment: post-procedure vital signs reviewed and stable Respiratory status: spontaneous breathing and respiratory function stable Cardiovascular status: blood pressure returned to baseline and stable Postop Assessment: no headache and no apparent nausea or vomiting Anesthetic complications: no Comments: Late entry   No notable events documented.   Last Vitals:  Vitals:   10/30/22 1017 10/30/22 1351  BP: 117/67 (!) 96/45  Pulse: (!) 57 80  Resp: 19 (!) 21  Temp: 36.7 C 36.7 C  SpO2: 99% 100%    Last Pain:  Vitals:   10/30/22 1351  TempSrc: Oral  PainSc: 0-No pain                 Louann Sjogren

## 2022-11-04 LAB — SURGICAL PATHOLOGY

## 2022-11-11 ENCOUNTER — Encounter (HOSPITAL_COMMUNITY): Payer: Self-pay | Admitting: Internal Medicine

## 2023-02-03 NOTE — Progress Notes (Unsigned)
GI Office Note    Referring Provider: Eduardo Osier, MD Primary Care Physician:  Eduardo Osier, MD Primary Gastroenterologist: Elon Alas. Abbey Chatters, DO  Date:  02/04/2023  ID:  Mertie Clause, DOB 06-22-1976, MRN [id number]   Chief Complaint   Chief Complaint  Patient presents with   Follow-up    Patient here today for a follow up visit after Tcs and Egd preformed by Dr. Abbey Chatters on 10/30/2022. Patient is still experiencing the left upper Quadrant pain. Denies any other current gi issues.  Reflux is controlled with esomeprazole 40 mg bid.    History of Present Illness  Xareni Mirabito is a 47 y.o. female with a history of anxiety, depression, GERD, IBS, pituitary tumor presenting today for follow up.  Colonoscopy 08/04/2005: -normal per Dr. Donnajean Lopes -random colon biopsies performed. -mild chronic inflammation of colon and duodenum.    EGD 08/06/15: -normal esophagus s/p dilation -small hiatal hernia -mild non erosive gastritis (reactive gastropathy) -gastric polyps s/p biopsy (benign) -carafate as needed, nexium BID, lose weight, eat smaller meals -Normal duodenum s/p biopsy. (Normal)  Last office visit 10/22/22 for LUQ abdominal pain. Pain for about 6 months and uncomfortable to sleep on this side. Early satiety present. Change in bowel habits. Used to have IBS-D with 6BM per day. Pain 5/10 in her sleep. Rare nausea. Burning with spicy foods. Lots of stress. Occasional NSAID use. Nexium works best for her. Given carafate for 2 weeks. Labs ordered and scheduled for colonoscopy and EGD.   Labs 10/27/22: CBC, CMP, Lipase, TSH all normal  EGD 10/30/22: -Small hiatal hernia -Non- obstructing Schatzki ring -Gastritis s/p biopsy -Normal duodenum -Use PPI BID  Colonoscopy 10/30/22: -Non- bleeding internal hemorrhoids -Exam otherwise normal -Repeat colonoscopy in 10 years  Today: GERD - Doing well on Nexium twice daily. No burning. No N/V. Does feel full all the time.    Started fiber daily and that is helping with her bowel. Only having 1-2 per day vs 6 per day. No change to bloating and no increased gas. No melena/brbpr.   LUQ pain - Still having pain. Pain is all the time, still unable to lay on that side. Feels a lot of pressure. Not a sharp pain when laying on it. Does not feel like all of her organs have enough room there. No cramping feeling. No abdominal surgeries to the left side. States she had mono when she had Covid and this pain reminds her of the way she felt during that time.   Report years ago she had some swollen nodes around her pancreas but did not require surgery.    Current Outpatient Medications  Medication Sig Dispense Refill   busPIRone (BUSPAR) 5 MG tablet Take 5 mg by mouth 3 (three) times daily.     cabergoline (DOSTINEX) 0.5 MG tablet Take 0.5 mg by mouth 2 (two) times a week.     carvedilol (COREG) 25 MG tablet Take 25 mg by mouth 2 (two) times daily with a meal.     cetirizine (ZYRTEC) 10 MG tablet Take 10 mg by mouth at bedtime.     Cholecalciferol (VITAMIN D) 125 MCG (5000 UT) CAPS Take 5,000 Units by mouth daily.     diazepam (VALIUM) 5 MG tablet Take 5 mg by mouth every 12 (twelve) hours as needed for anxiety.     esomeprazole (NEXIUM) 40 MG capsule TAKE 1 CAPSULE TWICE DAILY BEFORE MEALS 180 capsule 1   gabapentin (NEURONTIN) 100 MG capsule Take 200 mg by mouth  at bedtime.     ibuprofen (ADVIL) 800 MG tablet Take 800 mg by mouth every 8 (eight) hours as needed for cramping.     losartan (COZAAR) 100 MG tablet Take 100 mg by mouth daily.  6   MAGNESIUM PO Take 250 mg by mouth at bedtime.     Probiotic Product (PROBIOTIC DAILY PO) Take 1 capsule by mouth daily.     venlafaxine XR (EFFEXOR XR) 75 MG 24 hr capsule Take 75 mg by mouth daily with breakfast.     No current facility-administered medications for this visit.    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 Algis Greenhouse: normal colonoscopy, Biopsy showed mild chronic inflammation. No evidence of microscopic colitis.   COLONOSCOPY WITH PROPOFOL N/A 10/30/2022   Procedure: COLONOSCOPY WITH PROPOFOL;  Surgeon: Eloise Harman, DO;  Location: [facility];  Service: Endoscopy;  Laterality: N/A;  2:15 pm   ESOPHAGEAL DILATION N/A 08/06/2015   Procedure:  ESOPHAGEAL DILATION;  Surgeon: Danie Binder, MD;  Location: [facility];  Service: Endoscopy;  Laterality: N/A;  [place] 15/16   ESOPHAGOGASTRODUODENOSCOPY  11/09/2009   Dr. Algis Greenhouse. Propofol. hiatal hernia. No Barrett's.   ESOPHAGOGASTRODUODENOSCOPY  11/09/2008   Dr. Algis Greenhouse: propofol.hiatal hernia and erosions. [id number] chronic gastritis/esophagitis   ESOPHAGOGASTRODUODENOSCOPY  11/09/2004   Dr. Docia Furl: normal upper endoscopy. Biopsies of the duodenum negative for celiac disease.TTG also normal   ESOPHAGOGASTRODUODENOSCOPY (EGD) WITH PROPOFOL N/A 08/06/2015   SLF: Dysphagia due to possible proximal esophageal web and/or reflux 2. Few gastric polyps 3. Dyspepsia due to mild non-erosive gastriitis and GERD    ESOPHAGOGASTRODUODENOSCOPY (EGD) WITH PROPOFOL N/A 10/30/2022   Procedure: ESOPHAGOGASTRODUODENOSCOPY (EGD) WITH PROPOFOL;  Surgeon: Eloise Harman, DO;  Location: [facility];  Service: Endoscopy;  Laterality: N/A;   NASAL SEPTUM SURGERY  11/09/2000   POLYPECTOMY N/A 08/06/2015   Procedure:  POLYPECTOMY;  Surgeon: Danie Binder, MD;  Location: [facility];  Service: Endoscopy;  Laterality: N/A;  gastric   TONSILLECTOMY  11/09/2010   TUBAL LIGATION      Family History  Problem Relation Age of Onset   Barrett's esophagus Sister    Cervical cancer Mother    Breast cancer Mother    Heart attack Father        age 41, deceased   Colon cancer Neg Hx    Colon polyps Neg Hx     Allergies as of 02/04/2023 - Review Complete 02/04/2023  Allergen Reaction Noted   Biaxin [clarithromycin] Hives 06/19/2015   Lidocaine Other (See Comments) 10/26/2022   Lidocaine viscous hcl  08/12/2015   Tequin [gatifloxacin] Hives and Swelling 06/19/2015   Viberzi [eluxadoline]  08/02/2015    Social History   Socioeconomic History   Marital status: Married    Spouse name: Not on file  Number of children: 2   Years of education: Not on file   Highest education level: Not on file  Occupational History   Occupation: special education teacher aide  Tobacco Use   Smoking status: Never   Smokeless tobacco: Never   Tobacco comments:    Never smoked  Vaping Use   Vaping Use: Never used  Substance and Sexual Activity   Alcohol use: No    Alcohol/week: 0.0 standard drinks of alcohol   Drug use: No   Sexual activity: Not on file  Other Topics Concern   Not on file  Social History Narrative   Not on file   Social Determinants of Health   Financial Resource Strain: Not on file  Food Insecurity: Not on file  Transportation Needs: Not on file  Physical Activity: Not on file  Stress: Not on file  Social Connections: Not on file     Review of Systems   Gen: Denies fever, chills, anorexia. Denies fatigue, weakness, weight loss.  CV: Denies chest pain, palpitations, syncope, peripheral edema, and claudication. Resp: Denies dyspnea at rest, cough, wheezing, coughing up blood, and pleurisy. GI: See HPI Derm: Denies rash, itching, dry skin Psych: Denies depression, anxiety, memory loss,  confusion. No homicidal or suicidal ideation.  Heme: Denies bruising, bleeding, and enlarged lymph nodes.   Physical Exam   BP 131/84 (BP Location: Left Arm, Patient Position: Sitting, Cuff Size: Large)   Pulse 73   Temp 98.7 F (37.1 C) (Temporal)   Ht 5\' 2"  (1.575 m)   Wt 254 lb 6.4 oz (115.4 kg)   BMI 46.53 kg/m   General:   Alert and oriented. No distress noted. Pleasant and cooperative.  Head:  Normocephalic and atraumatic. Eyes:  Conjuctiva clear without scleral icterus. Mouth:  Oral mucosa pink and moist. Good dentition. No lesions. Lungs:  Clear to auscultation bilaterally. No wheezes, rales, or rhonchi. No distress.  Heart:  S1, S2 present without murmurs appreciated.  Abdomen:  +BS, soft, non-distended. Ttp to epigastrium and left upper quadrant.  No rebound or guarding. No hepatomegaly, hernias, or masses noted.  No splenomegaly noted however difficult given body habitus. Rectal: deferred Msk:  Symmetrical without gross deformities. Normal posture. Extremities:  Without edema. Neurologic:  Alert and  oriented x4 Psych:  Alert and cooperative. Normal mood and affect.   Assessment  Yanelie Polin is a 47 y.o. female with a history of anxiety, depression, GERD, IBS, pituitary tumor presenting today for follow up.  GERD, LUQ pain: GERD well-controlled on Nexium 40 mg twice daily.  Continues to have left upper quadrant pain/tenderness throughout the day but worsens when she lies on her left side, described as a fullness feeling.  She does have a history of COVID as well as reported mono in November 2023.  Suspect some of this could be related to splenomegaly however difficult to assess on exam.  She has not had any recent abdominal imaging and since her EGD was fairly negative other than gastritis we will proceed with a CT scan to further evaluate her abdominal pain.  Interestingly she notes a remote history of swollen peripancreatic lymph nodes.   Alternating the patient and  diarrhea: Previous history of IBS diarrhea predominant however at her last visit she reported maybe only going once per day.  Stools continue to be yellow.  Doing well on Benefiber once daily having 1-2 bowel movements per day.  No increase in bloating or gas.  Will continue current regimen for now.  PLAN   Continue Nexium 40 mg BID CT A/P  Continue benefiber daily.  Follow up in 2-3 months.     Venetia Night, MSN, FNP-BC, AGACNP-BC [facility]

## 2023-02-04 ENCOUNTER — Ambulatory Visit (INDEPENDENT_AMBULATORY_CARE_PROVIDER_SITE_OTHER): Payer: Federal, State, Local not specified - PPO | Admitting: Gastroenterology

## 2023-02-04 ENCOUNTER — Encounter: Payer: Self-pay | Admitting: Gastroenterology

## 2023-02-04 ENCOUNTER — Telehealth (INDEPENDENT_AMBULATORY_CARE_PROVIDER_SITE_OTHER): Payer: Self-pay | Admitting: Gastroenterology

## 2023-02-04 VITALS — BP 131/84 | HR 73 | Temp 98.7°F | Ht 62.0 in | Wt 254.4 lb

## 2023-02-04 DIAGNOSIS — K219 Gastro-esophageal reflux disease without esophagitis: Secondary | ICD-10-CM | POA: Diagnosis not present

## 2023-02-04 DIAGNOSIS — R198 Other specified symptoms and signs involving the digestive system and abdomen: Secondary | ICD-10-CM | POA: Diagnosis not present

## 2023-02-04 DIAGNOSIS — R1012 Left upper quadrant pain: Secondary | ICD-10-CM | POA: Diagnosis not present

## 2023-02-04 NOTE — Telephone Encounter (Signed)
CT A/P scheduled for 02/15/23 at [facility]. Pt will need to arrive at 2:15 pm to begin drinking contrast at 2:30 pm. NPO after [id number]. No pre cert needed for procedure. Contacted pt to make her aware. Pt would like my chart message sent with appt.

## 2023-02-04 NOTE — Patient Instructions (Signed)
We will set you up for a CT scan of your abdomen and pelvis to further evaluate your left upper quadrant pain.  Continue taking your Nexium 40 mg twice daily.  Continue to follow a GERD diet:  Avoid fried, fatty, greasy, spicy, citrus foods. Avoid caffeine and carbonated beverages. Avoid chocolate. Eat 4-6 small meals a day rather than 3 large meals. Do not eat within 3 hours of laying down. Prop head of bed up on wood or bricks to create a 6 inch incline.  Continue Benefiber once daily.  It was a pleasure to see you today. I want to create trusting relationships with patients. If you receive a survey regarding your visit,  I greatly appreciate you taking time to fill this out on paper or through your MyChart. I value your feedback.  Venetia Night, MSN, FNP-BC, AGACNP-BC [facility]

## 2023-02-15 ENCOUNTER — Encounter (HOSPITAL_COMMUNITY): Payer: Self-pay

## 2023-02-15 ENCOUNTER — Ambulatory Visit (HOSPITAL_COMMUNITY)
Admission: RE | Admit: 2023-02-15 | Discharge: 2023-02-15 | Disposition: A | Discharge status: Home / Self Care | Payer: Federal, State, Local not specified - PPO | Source: Ambulatory Visit | Attending: Gastroenterology | Admitting: Gastroenterology

## 2023-02-15 DIAGNOSIS — R1012 Left upper quadrant pain: Secondary | ICD-10-CM | POA: Diagnosis present

## 2023-02-15 MED ORDER — IOHEXOL 300 MG/ML  SOLN
100.0000 mL | Freq: Once | INTRAMUSCULAR | Status: AC | PRN
  Administered 2023-02-15: 100 mL via INTRAVENOUS

## 2023-05-10 ENCOUNTER — Ambulatory Visit: Payer: Federal, State, Local not specified - PPO | Admitting: Gastroenterology

## 2023-05-10 ENCOUNTER — Encounter: Payer: Self-pay | Admitting: Gastroenterology

## 2023-05-10 VITALS — BP 113/69 | HR 82 | Temp 98.6°F | Ht 62.0 in | Wt 247.0 lb

## 2023-05-10 DIAGNOSIS — K219 Gastro-esophageal reflux disease without esophagitis: Secondary | ICD-10-CM | POA: Diagnosis not present

## 2023-05-10 DIAGNOSIS — K760 Fatty (change of) liver, not elsewhere classified: Secondary | ICD-10-CM

## 2023-05-10 DIAGNOSIS — R1012 Left upper quadrant pain: Secondary | ICD-10-CM

## 2023-05-10 DIAGNOSIS — R198 Other specified symptoms and signs involving the digestive system and abdomen: Secondary | ICD-10-CM

## 2023-05-10 NOTE — Patient Instructions (Addendum)
As we discussed is working on Hospital doctor Nexium. Lets try either alternating days where you take it twice and the next once per day or just going to once a day and monitor for worsening symptoms.  Given you have been on twice daily dosing for a while this may need to be a slower weaning process over the course of your weight loss.  For now I will keep your prescription to twice a day that way if once daily dosing does not work you can continue with twice daily.  Continue daily fiber supplement and if you experience any worsening constipation as you increase your dose of Wegovy you may use a stool softener like Colace or laxative such as Dulcolax as needed.  Congratulations on your weight loss so far!  Please let me know if you need anything in the meantime otherwise we will follow-up in 6 months.  It was a pleasure to see you today. I want to create trusting relationships with patients. If you receive a survey regarding your visit,  I greatly appreciate you taking time to fill this out on paper or through your MyChart. I value your feedback.  Brooke Bonito, MSN, FNP-BC, AGACNP-BC [facility]

## 2023-05-10 NOTE — Progress Notes (Signed)
GI Office Note    Referring Provider: Curtis Sites, MD Primary Care Physician:  Elnita Maxwell, FNP Primary Gastroenterologist: Hennie Duos. Marletta Lor, DO  Date:  05/10/2023  ID:  Tamara Lynch, DOB 03-21-1976, MRN [id number]   Chief Complaint   Chief Complaint  Patient presents with   Follow-up    Follow up on LUQ pain. Pt states she is better   History of Present Illness  Tamara Lynch is a 47 y.o. female with a history of anxiety, depression, GERD, IBS, pituitary tumor presenting today for follow-up.  Colonoscopy 08/04/2005: -normal per Dr. Shaune Pascal -random colon biopsies performed. -mild chronic inflammation of colon and duodenum.    EGD 08/06/15: -normal esophagus s/p dilation -small hiatal hernia -mild non erosive gastritis (reactive gastropathy) -gastric polyps s/p biopsy (benign) -carafate as needed, nexium BID, lose weight, eat smaller meals -Normal duodenum s/p biopsy. (Normal)   Last office visit 10/22/22 for LUQ abdominal pain. Pain for about 6 months and uncomfortable to sleep on this side. Early satiety present. Change in bowel habits. Used to have IBS-D with 6BM per day. Pain 5/10 in her sleep. Rare nausea. Burning with spicy foods. Lots of stress. Occasional NSAID use. Nexium works best for her. Given carafate for 2 weeks. Labs ordered and scheduled for colonoscopy and EGD.    Labs 10/27/22: CBC, CMP, Lipase, TSH all normal   EGD 10/30/22: -Small hiatal hernia -Non- obstructing Schatzki ring -Gastritis s/p biopsy -Normal duodenum -Use PPI BID   Colonoscopy 10/30/22: -Non- bleeding internal hemorrhoids -Exam otherwise normal -Repeat colonoscopy in 10 years  Last office visit 02/04/23.  GERD doing well on Nexium twice daily.  Denies any nausea or vomiting but still having some abdominal fullness.  Started taking fiber supplement daily and is helping with her bowel movements, only having 1-2/day versus 6/day.  Denies any melena or BRBPR.  Still having left  lower quadrant pain with inability to lay on her left side and feelings of a lot of pressure.  Her pain reminds her of the pain she had when she had mono and COVID.  Advised Nexium 40 BID. CT A/P. Continue benefiber.  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 Pt declined and would like to try dietary changes)  Labs 05/03/2023: Potassium 4.2, sodium 134, normal LFTs.  TSH within normal limits.  A1c 5.1.  CBC normal.  Today: GERD - Doing a mediterranean diet and has been doing well on that. Started Wegovy in April and has lost 10 lbs already. She thinks the caffeine intake and also her intake of snack foods. Her stress level has also decreased. Lost her mother in May and that was a big stressor in her life given care. Doing well on nexium.   While on vacation she had ice cream and fried fish and she had some abdominal pain but overall no longer having pain. No melena or brbpr.   IBS - Reginal Lutes has made her a little constipation but has been religous with her benefiber and that is helping. Does not have to strain. No longer having yellow bowel movements.   Current Outpatient Medications  Medication Sig Dispense Refill   busPIRone (BUSPAR) 5 MG tablet Take 5 mg by mouth 3 (three) times daily.     cabergoline (DOSTINEX) 0.5 MG tablet Take 0.5 mg by mouth 2 (two) times a week.     carvedilol (COREG) 25 MG tablet Take  25 mg by mouth 2 (two) times daily with a meal.     cetirizine (ZYRTEC) 10 MG tablet Take 10 mg by mouth at bedtime.     Cholecalciferol (VITAMIN D) 125 MCG (5000 UT) CAPS Take 5,000 Units by mouth daily.     diazepam (VALIUM) 5 MG tablet Take 5 mg by mouth every 12 (twelve) hours as needed for anxiety.     esomeprazole (NEXIUM) 40 MG capsule TAKE 1 CAPSULE TWICE DAILY BEFORE MEALS 180 capsule 1   gabapentin (NEURONTIN) 100 MG capsule Take 200 mg  by mouth at bedtime.     ibuprofen (ADVIL) 800 MG tablet Take 800 mg by mouth every 8 (eight) hours as needed for cramping.     losartan (COZAAR) 100 MG tablet Take 100 mg by mouth daily.  6   MAGNESIUM PO Take 250 mg by mouth at bedtime.     Probiotic Product (PROBIOTIC DAILY PO) Take 1 capsule by mouth daily.     venlafaxine XR (EFFEXOR XR) 75 MG 24 hr capsule Take 75 mg by mouth daily with breakfast.     No current facility-administered medications for this visit.    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 Elder Cyphers: normal colonoscopy, Biopsy showed mild chronic inflammation. No evidence of microscopic colitis.   COLONOSCOPY WITH PROPOFOL N/A 10/30/2022   Procedure: COLONOSCOPY WITH PROPOFOL;  Surgeon: Lanelle Bal, DO;  Location: [facility];  Service: Endoscopy;  Laterality: N/A;  2:15 pm   ESOPHAGEAL DILATION N/A 08/06/2015   Procedure:  ESOPHAGEAL DILATION;  Surgeon: West Bali, MD;  Location: [facility];  Service: Endoscopy;  Laterality: N/A;  [place] 15/16   ESOPHAGOGASTRODUODENOSCOPY  11/09/2009   Dr. Elder Cyphers. Propofol. hiatal hernia. No Barrett's.   ESOPHAGOGASTRODUODENOSCOPY  11/09/2008   Dr. Elder Cyphers: propofol.hiatal hernia and erosions. ZO:XWRU chronic gastritis/esophagitis   ESOPHAGOGASTRODUODENOSCOPY  11/09/2004   Dr. Virgilio Belling: normal upper endoscopy. Biopsies of the  duodenum negative for celiac disease.TTG also normal   ESOPHAGOGASTRODUODENOSCOPY (EGD) WITH PROPOFOL N/A 08/06/2015   SLF: Dysphagia due to possible proximal esophageal web and/or reflux 2. Few gastric polyps 3. Dyspepsia due to mild non-erosive gastriitis and GERD    ESOPHAGOGASTRODUODENOSCOPY (EGD) WITH PROPOFOL N/A 10/30/2022   Procedure: ESOPHAGOGASTRODUODENOSCOPY (EGD) WITH PROPOFOL;  Surgeon: Lanelle Bal, DO;  Location: [facility];  Service: Endoscopy;  Laterality: N/A;   NASAL SEPTUM SURGERY  11/09/2000   POLYPECTOMY N/A 08/06/2015   Procedure: POLYPECTOMY;  Surgeon: West Bali, MD;  Location: [facility];  Service: Endoscopy;  Laterality: N/A;  gastric   TONSILLECTOMY  11/09/2010   TUBAL LIGATION      Family History  Problem Relation Age of Onset   Barrett's esophagus Sister    Cervical cancer Mother    Breast cancer Mother  Heart attack Father        age 51, deceased   Colon cancer Neg Hx    Colon polyps Neg Hx     Allergies as of 05/10/2023 - Review Complete 05/10/2023  Allergen Reaction Noted   Biaxin [clarithromycin] Hives 06/19/2015   Lidocaine Other (See Comments) 10/26/2022   Lidocaine viscous hcl  08/12/2015   Tequin [gatifloxacin] Hives and Swelling 06/19/2015   Viberzi [eluxadoline]  08/02/2015    Social History   Socioeconomic History   Marital status: Married    Spouse name: Not on file   Number of children: 2   Years of education: Not on file   Highest education level: Not on file  Occupational History   Occupation: Chief Financial Officer  Tobacco Use   Smoking status: Never   Smokeless tobacco: Never   Tobacco comments:    Never smoked  Vaping Use   Vaping Use: Never used  Substance and Sexual Activity   Alcohol use: No    Alcohol/week: 0.0 standard drinks of alcohol   Drug use: No   Sexual activity: Not on file  Other Topics Concern   Not on file  Social History Narrative   Not on file   Social Determinants of Health    Financial Resource Strain: Not on file  Food Insecurity: Not on file  Transportation Needs: Not on file  Physical Activity: Not on file  Stress: Not on file  Social Connections: Not on file     Review of Systems   Gen: Denies fever, chills, anorexia. Denies fatigue, weakness, weight loss.  CV: Denies chest pain, palpitations, syncope, peripheral edema, and claudication. Resp: Denies dyspnea at rest, cough, wheezing, coughing up blood, and pleurisy. GI: See HPI Derm: Denies rash, itching, dry skin Psych: Denies depression, anxiety, memory loss, confusion. No homicidal or suicidal ideation.  Heme: Denies bruising, bleeding, and enlarged lymph nodes.   Physical Exam   BP 113/69   Pulse 82   Temp 98.6 F (37 C)   Ht 5\' 2"  (1.575 m)   Wt 247 lb (112 kg)   LMP 04/26/2023   BMI 45.18 kg/m   General:   Alert and oriented. No distress noted. Pleasant and cooperative.  Head:  Normocephalic and atraumatic. Eyes:  Conjuctiva clear without scleral icterus. Mouth:  Oral mucosa pink and moist. Good dentition. No lesions. Lungs:  Clear to auscultation bilaterally. No wheezes, rales, or rhonchi. No distress.  Heart:  S1, S2 present without murmurs appreciated.  Abdomen:  +BS, soft, non-tender and non-distended. No rebound or guarding. No HSM or masses noted.  Rectal: deferred Msk:  Symmetrical without gross deformities. Normal posture. Extremities:  Without edema. Neurologic:  Alert and  oriented x4 Psych:  Alert and cooperative. Normal mood and affect.   Assessment  Tamara Lynch is a 47 y.o. female with a history of  anxiety, depression, GERD, IBS, pituitary tumor presenting today for follow-up.  GERD: Has been doing well on Nexium twice daily but given recent weight loss and dietary changes I suggested attempt at weaning PPI for which she has agreed. We will likely do a slow wean with alternating once daily and twice daily dosing or trialing once daily and taking an additional  dose as needed.   Hepatic steatosis: CT in April with hepatomegaly and hepatic steatosis.  Recent LFTs last week within normal range.  Has made dietary lifestyle changes with switching to Mediterranean diet and is also on Wegovy and has lost 10 pounds in  the last 2 months.   Alternating constipation/diarrhea: Previously having yellow stools that were more loose in nature and previously having about 6/day that was improved to 1-2/day with fiber intake.  Since being on Wegovy she is having more solid stools and some occasional constipation but continues with daily fiber supplement and ensuring she is getting an adequate amount of water.  Advised that she may use Colace or Dulcolax as needed if she develops worsening constipation as her Wegovy dose is increased.  PLAN   Work on weaning Nexium to once daily.  Continue fiber supplement.  May use Colace or Dulcolax as needed if you develop worsening constipation Continue to work toward weight loss. Follow up in 6 months    Brooke Bonito, MSN, FNP-BC, AGACNP-BC [facility]

## 2023-11-15 ENCOUNTER — Ambulatory Visit: Payer: Federal, State, Local not specified - PPO | Admitting: Gastroenterology

## 2024-09-27 ENCOUNTER — Other Ambulatory Visit: Payer: Self-pay | Admitting: Gastroenterology

## 2024-09-29 ENCOUNTER — Telehealth: Payer: Self-pay | Admitting: Gastroenterology

## 2024-09-29 NOTE — Telephone Encounter (Signed)
 Called patient to schedule OV for refills. Left patient a voicemail to give us  a call back.

## 2024-11-22 ENCOUNTER — Encounter: Payer: Self-pay | Admitting: Gastroenterology

## 2024-11-22 ENCOUNTER — Ambulatory Visit: Admitting: Gastroenterology

## 2024-11-22 VITALS — BP 124/79 | HR 58 | Temp 98.0°F | Ht 62.0 in | Wt 266.4 lb

## 2024-11-22 DIAGNOSIS — K5900 Constipation, unspecified: Secondary | ICD-10-CM

## 2024-11-22 DIAGNOSIS — R1310 Dysphagia, unspecified: Secondary | ICD-10-CM | POA: Diagnosis not present

## 2024-11-22 DIAGNOSIS — K760 Fatty (change of) liver, not elsewhere classified: Secondary | ICD-10-CM

## 2024-11-22 DIAGNOSIS — K219 Gastro-esophageal reflux disease without esophagitis: Secondary | ICD-10-CM | POA: Diagnosis not present

## 2024-11-22 DIAGNOSIS — K5909 Other constipation: Secondary | ICD-10-CM | POA: Diagnosis not present

## 2024-11-22 DIAGNOSIS — Z8719 Personal history of other diseases of the digestive system: Secondary | ICD-10-CM

## 2024-11-22 MED ORDER — ESOMEPRAZOLE MAGNESIUM 40 MG PO CPDR: 40.0000 mg | DELAYED_RELEASE_CAPSULE | Freq: Every day | ORAL | 3 refills | Status: AC

## 2024-11-22 NOTE — Progress Notes (Signed)
 "  GI Office Note    Referring Provider: Bari Clarita DEL, FNP Primary Care Physician:  Bari Clarita DEL, FNP Primary Gastroenterologist: Carlin POUR. Cindie, DO  Date:  11/22/2024  ID:  Tamara Lynch, DOB 08-18-1976, MRN [id number]   Chief Complaint   Chief Complaint  Patient presents with   Follow-up    Follow up. Sometime has a had time swallowing    History of Present Illness  Tamara Lynch is a 49 y.o. female with a history of anxiety, depression, GERD, IBS, pituitary tumor presenting today for follow-up with some complaints of dysphagia.  Colonoscopy 08/04/2005: -normal per Dr. Virgie -random colon biopsies performed. -mild chronic inflammation of colon and duodenum.    EGD 08/06/15: -normal esophagus s/p dilation -small hiatal hernia -mild non erosive gastritis (reactive gastropathy) -gastric polyps s/p biopsy (benign) -carafate  as needed, nexium  BID, lose weight, eat smaller meals -Normal duodenum s/p biopsy. (Normal)   Last office visit 10/22/22 for LUQ abdominal pain. Pain for about 6 months and uncomfortable to sleep on this side. Early satiety present. Change in bowel habits. Used to have IBS-D with 6BM per day. Pain 5/10 in her sleep. Rare nausea. Burning with spicy foods. Lots of stress. Occasional NSAID use. Nexium  works best for her. Given carafate  for 2 weeks. Labs ordered and scheduled for colonoscopy and EGD.    Labs 10/27/22: CBC, CMP, Lipase, TSH all normal   EGD 10/30/22: -Small hiatal hernia -Non- obstructing Schatzki ring -Gastritis s/p biopsy -Normal duodenum -Use PPI BID   Colonoscopy 10/30/22: -Non- bleeding internal hemorrhoids -Exam otherwise normal -Repeat colonoscopy in 10 years  OV 02/04/23.  GERD doing well on Nexium  twice daily.  Denies any nausea or vomiting but still having some abdominal fullness.  Started taking fiber supplement daily and is helping with her bowel movements, only having 1-2/day versus 6/day.  Denies any melena or  BRBPR.  Still having left lower quadrant pain with inability to lay on her left side and feelings of a lot of pressure.  Her pain reminds her of the pain she had when she had mono and COVID.  Advised Nexium  40 BID. CT A/P. Continue benefiber.   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 Pt declined and would like to try dietary changes)   Labs 05/03/2023: Potassium 4.2, sodium 134, normal LFTs.  TSH within normal limits.  A1c 5.1.  CBC normal   Last office visit July 2024.  Reportedly following Mediterranean diet was doing well on this.  She has started [facility] in April and had lost 10 pounds.  Thought caffeine and snack foods contribute to her reflux symptoms.  Stress level has also decreased.  Big stressor had been taking care of her mom which she had recently lost her a couple months prior.  Doing well on Nexium .  Tamara Lynch was causing her some constipation but have not taking the Benefiber and that was helping.  No straining.  Advised to work on weaning Nexium  to once daily and continue fiber supplementation.  May use Colace or Dulcolax as needed.  Continue to work toward weight loss.  Today:  Discussed the use of AI scribe software for clinical note transcription with the patient, who gave verbal consent to proceed.  Recently transitioned from [facility] injection to oral Wegovy due to insurance coverage and cost, taking the pill daily in the morning with no more than four ounces of water  and  waiting at least thirty minutes before eating, drinking, or taking other medications.  Gastroesophageal reflux is managed with Nexium  once daily, with occasional second dose in the evening if trigger foods are consumed or symptoms are anticipated. No current nausea, vomiting, or abdominal pain.  Intermittent dysphagia persists. Husband has observed slower eating, and she  notes food seems to take longer to pass but denies choking or significant worsening. Attributes slower eating to increased awareness and chewing, especially since retiring. Symptoms are less severe than in the past.  Constipation is managed with daily Metamucil powder. Denies straining or blood in stool. No significant changes in bowel habits since starting oral Wegovy.  Yellow-colored stools have returned after previously resolving. No gray or white stools, and no recent episodes of steatorrhea or other concerning changes in stool consistency.  Routine laboratory monitoring for pituitary tumor every six months. Bilirubin previously elevated to 1.5 mg/dL, recently decreased to 1.2 mg/dL. Other liver enzymes remain within normal limits.  History of torn meniscus root with less inflammation and knee discomfort while on Wegovy injection, managed with knee taping. She is hopeful for similar benefits with the oral formulation.      She reported some intermittent fluctuations of her bilirubin recently however her most recent labs last week revealed  got it was back within normal limits.  Reviewed labs from [facility] from 11/14/24: Hemoglobin 12.6, platelets 199, sodium 137, albumin 4.3, ALT 15, AST 13, T. bili 1.2, alk phos 47, creatinine 0.5  SAFE score -22 (low risk) using BMI 40 even though she has a BMI of 48.   Wt Readings from Last 6 Encounters:  11/22/24 266 lb 6.4 oz (120.8 kg)  05/10/23 247 lb (112 kg)  02/04/23 254 lb 6.4 oz (115.4 kg)  10/30/22 252 lb 12.8 oz (114.7 kg)  10/27/22 252 lb 12.8 oz (114.7 kg)  10/22/22 252 lb 12.8 oz (114.7 kg)    Body mass index is 48.73 kg/m.   Current Outpatient Medications  Medication Sig Dispense Refill   amLODipine (NORVASC) 2.5 MG tablet TAKE 1 TABLET EVERY DAY BY ORAL ROUTE IN THE EVENING, FOR BLOOD PRESSURE. (Patient taking differently: 5 mg once a day)     busPIRone (BUSPAR) 5 MG tablet Take 5 mg by mouth 3 (three) times daily.      cabergoline (DOSTINEX) 0.5 MG tablet Take 0.5 mg by mouth 2 (two) times a week.     carvedilol (COREG) 25 MG tablet Take 25 mg by mouth 2 (two) times daily with a meal.     cetirizine (ZYRTEC) 10 MG tablet Take 10 mg by mouth at bedtime.     Cholecalciferol (VITAMIN D ) 125 MCG (5000 UT) CAPS Take 5,000 Units by mouth daily.     diazepam (VALIUM) 5 MG tablet Take 5 mg by mouth every 12 (twelve) hours as needed for anxiety.     esomeprazole  (NEXIUM ) 40 MG capsule TAKE 1 CAPSULE TWICE DAILY BEFORE MEALS (Patient taking differently: TAKE 1 CAPSULE ONCE DAILY BEFORE MEALS) 180 capsule 0   ibuprofen (ADVIL) 800 MG tablet Take 800 mg by mouth every 8 (eight) hours as needed for cramping.     losartan (COZAAR) 100 MG tablet Take 100 mg by mouth daily.  6   MAGNESIUM  PO Take 250 mg by mouth at bedtime.     venlafaxine XR (EFFEXOR XR) 75 MG 24 hr capsule Take 75 mg by mouth daily with breakfast.     gabapentin (NEURONTIN) 100 MG capsule Take 200 mg by  mouth at bedtime. (Patient not taking: Reported on 11/22/2024)     Probiotic Product (PROBIOTIC DAILY PO) Take 1 capsule by mouth daily. (Patient not taking: Reported on 11/22/2024)     No current facility-administered medications for this visit.    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 Ivy: normal colonoscopy, Biopsy showed mild chronic inflammation. No  evidence of microscopic colitis.   COLONOSCOPY WITH PROPOFOL  N/A 10/30/2022   Procedure: COLONOSCOPY WITH PROPOFOL ;  Surgeon: Cindie Carlin POUR, DO;  Location: [facility];  Service: Endoscopy;  Laterality: N/A;  2:15 pm   ESOPHAGEAL DILATION N/A 08/06/2015   Procedure:  ESOPHAGEAL DILATION;  Surgeon: Margo LITTIE Haddock, MD;  Location: [facility];  Service: Endoscopy;  Laterality: N/A;  [place] 15/16   ESOPHAGOGASTRODUODENOSCOPY  11/09/2009   Dr. Ivy. Propofol . hiatal hernia. No Barrett's.   ESOPHAGOGASTRODUODENOSCOPY  11/09/2008   Dr. Ivy: propofol .hiatal hernia and erosions. Ak:fpoi chronic gastritis/esophagitis   ESOPHAGOGASTRODUODENOSCOPY  11/09/2004   Dr. Elvis: normal upper endoscopy. Biopsies of the duodenum negative for celiac disease.TTG also normal   ESOPHAGOGASTRODUODENOSCOPY (EGD) WITH PROPOFOL  N/A 08/06/2015   SLF: Dysphagia due to possible proximal esophageal web and/or reflux 2. Few gastric polyps 3. Dyspepsia due to mild non-erosive gastriitis and GERD    ESOPHAGOGASTRODUODENOSCOPY (EGD) WITH PROPOFOL  N/A 10/30/2022   Procedure: ESOPHAGOGASTRODUODENOSCOPY (EGD) WITH PROPOFOL ;  Surgeon: Cindie Carlin POUR, DO;  Location: [facility];  Service: Endoscopy;  Laterality: N/A;   NASAL SEPTUM SURGERY  11/09/2000   POLYPECTOMY N/A 08/06/2015   Procedure: POLYPECTOMY;  Surgeon: Margo LITTIE Haddock, MD;  Location: [facility];  Service: Endoscopy;  Laterality: N/A;  gastric   TONSILLECTOMY  11/09/2010   TUBAL LIGATION      Family History  Problem Relation Age of Onset   Barrett's esophagus Sister    Cervical cancer Mother    Breast cancer Mother    Heart attack Father        age 80, deceased   Colon cancer Neg Hx    Colon polyps Neg Hx     Allergies as of 11/22/2024 - Review Complete 11/22/2024  Allergen Reaction Noted   Biaxin [clarithromycin] Hives 06/19/2015   Lidocaine  Other (See Comments) 10/26/2022   Lidocaine  viscous hcl  08/12/2015   Tequin [gatifloxacin] Hives and  Swelling 06/19/2015   Viberzi  [eluxadoline ]  08/02/2015    Social History   Socioeconomic History   Marital status: Married    Spouse name: Not on file   Number of children: 2   Years of education: Not on file   Highest education level: Not on file  Occupational History   Occupation: chief financial officer  Tobacco Use   Smoking status: Never   Smokeless tobacco: Never   Tobacco comments:    Never smoked  Vaping Use  Vaping status: Never Used  Substance and Sexual Activity   Alcohol use: No    Alcohol/week: 0.0 standard drinks of alcohol   Drug use: No   Sexual activity: Yes    Birth control/protection: None  Other Topics Concern   Not on file  Social History Narrative   Not on file   Social Drivers of Health   Tobacco Use: Low Risk (11/22/2024)   Patient History    Smoking Tobacco Use: Never    Smokeless Tobacco Use: Never    Passive Exposure: Not on file  Financial Resource Strain: Not on file  Food Insecurity: Not on file  Transportation Needs: Not on file  Physical Activity: Not on file  Stress: Not on file  Social Connections: Not on file  Depression (EYV7-0): Not on file  Alcohol Screen: Not on file  Housing: Not on file  Utilities: Not on file  Health Literacy: Not on file    Review of Systems   Gen: Denies fever, chills, anorexia. Denies fatigue, weakness, weight loss.  CV: Denies chest pain, palpitations, syncope, peripheral edema, and claudication. Resp: Denies dyspnea at rest, cough, wheezing, coughing up blood, and pleurisy. GI: See HPI Derm: Denies rash, itching, dry skin Psych: Denies depression, anxiety, memory loss, confusion. No homicidal or suicidal ideation.  Heme: Denies bruising, bleeding, and enlarged lymph nodes. + knee pain  Physical Exam   BP 124/79 (BP Location: Right Arm, Patient Position: Sitting, Cuff Size: Large)   Pulse (!) 58   Temp 98 F (36.7 C) (Temporal)   Ht 5' 2 (1.575 m)   Wt 266 lb 6.4 oz (120.8 kg)    LMP 11/11/2024 (Approximate)   BMI 48.73 kg/m   General:   Alert and oriented. No distress noted. Pleasant and cooperative.  Head:  Normocephalic and atraumatic. Eyes:  Conjuctiva clear without scleral icterus. Mouth:  Oral mucosa pink and moist. Good dentition. No lesions. Abdomen:  +BS, soft, non-distended. Mild ttp to epigastrium. No rebound or guarding. No HSM or masses noted, unable to fully assess given body habitus.  Rectal: deferred Msk:  Symmetrical without gross deformities. Normal posture. Extremities:  Without edema. Neurologic:  Alert and  oriented x4 Psych:  Alert and cooperative. Normal mood and affect.  Assessment & Plan  Tamara Lynch is a 49 y.o. female presenting today for follow-up and medication refill.    Gastroesophageal reflux disease Chronic, well-controlled on daily esomeprazole  40 mg, with occasional need for a second dose in the evening when consuming trigger foods. No evidence of acute exacerbation. - Refilled esomeprazole  (Nexium ) prescription for mail order pharmacy. - GERD diet reinforced.  Constipation Chronic, currently well-managed with daily fiber supplementation. The impact of recently initiated oral semaglutide [facility]) on bowel habits remains to be determined.  Currently taking Metamucil 1 tablespoon daily. - Advised continuation of current Metamucil regimen. - Instructed to monitor for worsening constipation and report symptoms if she develops. - Discussed option to initiate polyethylene glycol (Miralax) if constipation worsens.  Intermittent dysphagia Mild, intermittent symptoms, currently manageable with behavioral modifications. No acute worsening or need for intervention.  Does have history of Schatzki's ring. - Discussed ongoing symptom monitoring and emphasized careful chewing and moistening of food. - Discussed possible future evaluation with esophagram if symptoms worsen. - Could perform upper endoscopy if episodes of choking noted  or concern for food impaction  History of mildly elevated bilirubin, hepatic steatosis Stable, with recent improvement in bilirubin and normal liver enzymes. Mildly yellow stools likely due to increased  bile or dietary factors. Possible benign Gilbert syndrome discussed. Hepatic steatosis present, with potential anti-inflammatory benefit from semaglutide [facility]). SAFE score low.  - Reviewed recent liver function tests and bilirubin trends. - Advised routine monitoring with primary care every six months. - Instructed to report new symptoms such as acholic stools or other signs of hepatic dysfunction.     Follow up   Follow up 1 year, sooner if needed    Charmaine Melia, MSN, FNP-BC, AGACNP-BC [facility] "

## 2024-11-22 NOTE — Patient Instructions (Signed)
 Continue fiber supplementation daily. If you find that the oral wegovy starts to cause some constipation then start with miralax and/or a stool softener and if you are still not going adequately then please reach back out.   Continue Nexium  40 mg once daily for reflux.  Follow a GERD diet:  Avoid fried, fatty, greasy, spicy, citrus foods. Avoid caffeine and carbonated beverages. Avoid chocolate. Try eating 4-6 small meals a day rather than 3 large meals. Do not eat within 3 hours of laying down. Prop head of bed up on wood or bricks to create a 6 inch incline.  If swallowing issue worsens or having choking episodes please let me know and we can consider an esophagram to look for narrowing or motility issue instead of jumping straight to upper endoscopy if you'd like.   Congratulations again on retirement!! Follow up in 1 year unless symptoms worsen.   It was a pleasure to see you today. I want to create trusting relationships with patients. If you receive a survey regarding your visit,  I greatly appreciate you taking time to fill this out on paper or through your MyChart. I value your feedback.  Charmaine Melia, MSN, FNP-BC, AGACNP-BC [facility]
